# Patient Record
Sex: Female | Born: 1950 | Race: Black or African American | Hispanic: No | Marital: Single | Smoking: Current every day smoker
Health system: Southern US, Community
[De-identification: ages and names within clinical notes are randomized; demographics above are authoritative.]

## PROBLEM LIST (undated history)

## (undated) DIAGNOSIS — M5481 Occipital neuralgia: Secondary | ICD-10-CM

## (undated) DIAGNOSIS — E785 Hyperlipidemia, unspecified: Secondary | ICD-10-CM

## (undated) DIAGNOSIS — C50919 Malignant neoplasm of unspecified site of unspecified female breast: Secondary | ICD-10-CM

## (undated) DIAGNOSIS — I10 Essential (primary) hypertension: Secondary | ICD-10-CM

## (undated) DIAGNOSIS — M1990 Unspecified osteoarthritis, unspecified site: Secondary | ICD-10-CM

## (undated) DIAGNOSIS — Z923 Personal history of irradiation: Secondary | ICD-10-CM

## (undated) DIAGNOSIS — F17200 Nicotine dependence, unspecified, uncomplicated: Secondary | ICD-10-CM

## (undated) DIAGNOSIS — F329 Major depressive disorder, single episode, unspecified: Secondary | ICD-10-CM

## (undated) DIAGNOSIS — C801 Malignant (primary) neoplasm, unspecified: Secondary | ICD-10-CM

## (undated) HISTORY — DX: Hyperlipidemia, unspecified: E78.5

## (undated) HISTORY — PX: BREAST LUMPECTOMY: SHX2

## (undated) HISTORY — DX: Essential (primary) hypertension: I10

## (undated) HISTORY — DX: Occipital neuralgia: M54.81

## (undated) HISTORY — PX: OTHER SURGICAL HISTORY: SHX169

## (undated) HISTORY — DX: Nicotine dependence, unspecified, uncomplicated: F17.200

## (undated) HISTORY — PX: PARTIAL HYSTERECTOMY: SHX80

## (undated) HISTORY — DX: Major depressive disorder, single episode, unspecified: F32.9

## (undated) HISTORY — PX: FOOT SURGERY: SHX648

---

## 2015-12-02 ENCOUNTER — Other Ambulatory Visit: Payer: Self-pay | Admitting: Family

## 2015-12-02 DIAGNOSIS — M545 Low back pain: Secondary | ICD-10-CM | POA: Diagnosis not present

## 2015-12-02 DIAGNOSIS — R350 Frequency of micturition: Secondary | ICD-10-CM | POA: Diagnosis not present

## 2015-12-02 DIAGNOSIS — F419 Anxiety disorder, unspecified: Secondary | ICD-10-CM | POA: Diagnosis not present

## 2015-12-02 DIAGNOSIS — R3129 Other microscopic hematuria: Secondary | ICD-10-CM | POA: Diagnosis not present

## 2015-12-02 DIAGNOSIS — G8929 Other chronic pain: Secondary | ICD-10-CM | POA: Diagnosis not present

## 2015-12-02 DIAGNOSIS — Z1231 Encounter for screening mammogram for malignant neoplasm of breast: Secondary | ICD-10-CM

## 2015-12-02 DIAGNOSIS — F5104 Psychophysiologic insomnia: Secondary | ICD-10-CM | POA: Diagnosis not present

## 2015-12-02 DIAGNOSIS — Z136 Encounter for screening for cardiovascular disorders: Secondary | ICD-10-CM | POA: Diagnosis not present

## 2015-12-02 DIAGNOSIS — Z8349 Family history of other endocrine, nutritional and metabolic diseases: Secondary | ICD-10-CM | POA: Diagnosis not present

## 2015-12-09 ENCOUNTER — Ambulatory Visit: Payer: Self-pay

## 2015-12-22 ENCOUNTER — Ambulatory Visit: Payer: Self-pay

## 2015-12-30 DIAGNOSIS — L309 Dermatitis, unspecified: Secondary | ICD-10-CM | POA: Diagnosis not present

## 2015-12-30 DIAGNOSIS — Z1211 Encounter for screening for malignant neoplasm of colon: Secondary | ICD-10-CM | POA: Diagnosis not present

## 2015-12-30 DIAGNOSIS — F418 Other specified anxiety disorders: Secondary | ICD-10-CM | POA: Diagnosis not present

## 2015-12-30 DIAGNOSIS — E049 Nontoxic goiter, unspecified: Secondary | ICD-10-CM | POA: Diagnosis not present

## 2015-12-30 DIAGNOSIS — Z23 Encounter for immunization: Secondary | ICD-10-CM | POA: Diagnosis not present

## 2016-02-02 DIAGNOSIS — L309 Dermatitis, unspecified: Secondary | ICD-10-CM | POA: Diagnosis not present

## 2016-02-02 DIAGNOSIS — F418 Other specified anxiety disorders: Secondary | ICD-10-CM | POA: Diagnosis not present

## 2016-03-02 DIAGNOSIS — G4700 Insomnia, unspecified: Secondary | ICD-10-CM | POA: Diagnosis not present

## 2016-03-02 DIAGNOSIS — E559 Vitamin D deficiency, unspecified: Secondary | ICD-10-CM | POA: Diagnosis not present

## 2016-03-02 DIAGNOSIS — M170 Bilateral primary osteoarthritis of knee: Secondary | ICD-10-CM | POA: Diagnosis not present

## 2016-03-19 DIAGNOSIS — M25512 Pain in left shoulder: Secondary | ICD-10-CM | POA: Diagnosis not present

## 2016-03-19 DIAGNOSIS — E559 Vitamin D deficiency, unspecified: Secondary | ICD-10-CM | POA: Diagnosis not present

## 2016-03-21 ENCOUNTER — Emergency Department (HOSPITAL_COMMUNITY)
Admission: EM | Admit: 2016-03-21 | Discharge: 2016-03-21 | Disposition: A | Discharge status: Home / Self Care | Payer: Commercial Managed Care - HMO | Attending: Emergency Medicine | Admitting: Emergency Medicine

## 2016-03-21 ENCOUNTER — Encounter (HOSPITAL_COMMUNITY): Payer: Self-pay | Admitting: *Deleted

## 2016-03-21 ENCOUNTER — Emergency Department (HOSPITAL_COMMUNITY): Payer: Commercial Managed Care - HMO

## 2016-03-21 DIAGNOSIS — R52 Pain, unspecified: Secondary | ICD-10-CM

## 2016-03-21 DIAGNOSIS — M19012 Primary osteoarthritis, left shoulder: Secondary | ICD-10-CM | POA: Insufficient documentation

## 2016-03-21 DIAGNOSIS — M1990 Unspecified osteoarthritis, unspecified site: Secondary | ICD-10-CM

## 2016-03-21 DIAGNOSIS — Z91040 Latex allergy status: Secondary | ICD-10-CM | POA: Diagnosis not present

## 2016-03-21 DIAGNOSIS — M25512 Pain in left shoulder: Secondary | ICD-10-CM | POA: Diagnosis not present

## 2016-03-21 DIAGNOSIS — M25412 Effusion, left shoulder: Secondary | ICD-10-CM | POA: Diagnosis not present

## 2016-03-21 DIAGNOSIS — F17200 Nicotine dependence, unspecified, uncomplicated: Secondary | ICD-10-CM | POA: Diagnosis not present

## 2016-03-21 HISTORY — DX: Unspecified osteoarthritis, unspecified site: M19.90

## 2016-03-21 LAB — BASIC METABOLIC PANEL
Anion gap: 8 (ref 5–15)
BUN: 7 mg/dL (ref 6–20)
CALCIUM: 9.7 mg/dL (ref 8.9–10.3)
CO2: 25 mmol/L (ref 22–32)
CREATININE: 0.75 mg/dL (ref 0.44–1.00)
Chloride: 103 mmol/L (ref 101–111)
GFR calc Af Amer: >60 mL/min (ref >60)
GLUCOSE: 73 mg/dL (ref 65–99)
POTASSIUM: 4.2 mmol/L (ref 3.5–5.1)
SODIUM: 136 mmol/L (ref 135–145)

## 2016-03-21 LAB — CBC WITH DIFFERENTIAL/PLATELET
BASOS ABS: 0.1 10*3/uL (ref 0.0–0.1)
Basophils Relative: 1 %
EOS ABS: 0.1 10*3/uL (ref 0.0–0.7)
Eosinophils Relative: 1 %
HEMATOCRIT: 39.8 % (ref 36.0–46.0)
Hemoglobin: 13.7 g/dL (ref 12.0–15.0)
LYMPHS ABS: 3.1 10*3/uL (ref 0.7–4.0)
Lymphocytes Relative: 46 %
MCH: 33 pg (ref 26.0–34.0)
MCHC: 34.4 g/dL (ref 30.0–36.0)
MCV: 95.9 fL (ref 78.0–100.0)
MONO ABS: 0.5 10*3/uL (ref 0.1–1.0)
Monocytes Relative: 7 %
NEUTROS ABS: 3.1 10*3/uL (ref 1.7–7.7)
Neutrophils Relative %: 45 %
PLATELETS: 251 10*3/uL (ref 150–400)
RBC: 4.15 MIL/uL (ref 3.87–5.11)
RDW: 13.3 % (ref 11.5–15.5)
WBC: 6.9 10*3/uL (ref 4.0–10.5)

## 2016-03-21 MED ORDER — PREDNISONE 10 MG PO TABS: 20.0000 mg | ORAL_TABLET | Freq: Two times a day (BID) | ORAL | 0 refills | Status: DC

## 2016-03-21 NOTE — ED Provider Notes (Signed)
[facility] DEPT Provider Note   CSN: [id number] Arrival date & time: 03/21/16  1040     History   Chief Complaint Chief Complaint  Patient presents with  . Edema    HPI Christina Sanchez is a 65 y.o. female.  She presents for evaluation of pain in her left clavicle region radiating to her posterior left shoulder and left arm, present for 5 days, without known trauma. No prior similar problem. She saw her PCP, for same, 2 days ago, at that time she had a breast examination, and no masses, polyps or drainage was found. She was referred to orthopedics home. She saw today, and after that was sent here for further evaluation. The orthopedic provider told the family that they were concerned that the patient has "an infection". The patient denies fever, chills, nausea, vomiting, cough, shortness of breath, weakness or dizziness. She has a history of "knee arthritis". There are no other known modifying factors.  HPI  Past Medical History:  Diagnosis Date  . Arthritis     There are no active problems to display for this patient.   History reviewed. No pertinent surgical history.  OB History    No data available       Home Medications    Prior to Admission medications   Medication Sig Start Date End Date Taking? Authorizing Provider  oxyCODONE-acetaminophen (PERCOCET) 7.5-325 MG tablet Take 1 tablet by mouth 3 (three) times daily as needed for pain. 03/02/16  Yes Historical Provider, MD  zolpidem (AMBIEN) 10 MG tablet Take 10 mg by mouth at bedtime. 03/15/16  Yes Historical Provider, MD  predniSONE (DELTASONE) 10 MG tablet Take 2 tablets (20 mg total) by mouth 2 (two) times daily. 03/21/16   Daleen Bo, MD    Family History History reviewed. No pertinent family history.  Social History Social History  Substance Use Topics  . Smoking status: Current Every Day Smoker  . Smokeless tobacco: Not on file  . Alcohol use Yes     Comment: occ wine     Allergies     Latex; Lexapro [escitalopram]; and Penicillins   Review of Systems Review of Systems  All other systems reviewed and are negative.    Physical Exam Updated Vital Signs BP 125/77   Pulse 71   Temp 98.4 F (36.9 C) (Oral)   Resp 18   Ht 5\' 6"  (1.676 m)   Wt 141 lb (64 kg)   SpO2 97%   BMI 22.76 kg/m   Physical Exam  Constitutional: She is oriented to person, place, and time. She appears well-developed and well-nourished.  HENT:  Head: Normocephalic and atraumatic.  Eyes: Conjunctivae and EOM are normal. Pupils are equal, round, and reactive to light.  Neck: Normal range of motion and phonation normal. Neck supple. No tracheal deviation present. No thyromegaly present.  Cardiovascular: Normal rate and regular rhythm.   Pulmonary/Chest: Effort normal and breath sounds normal. No stridor. She exhibits no tenderness.  Abdominal: Soft. She exhibits no distension. There is no tenderness. There is no guarding.  Musculoskeletal:  Somewhat decreased left arm and shoulder motion secondary to pain in the left medial clavicle. Left clavicular ulcer junction, with localized swelling, firm in consistency, overlying redness, and moderate tenderness. There is some mild indistinct associated small lymphadenopathy in the left supra clavicular region.  Neurological: She is alert and oriented to person, place, and time. She exhibits normal muscle tone.  Skin: Skin is warm and dry.  Psychiatric: She has a normal mood  and affect. Her behavior is normal. Judgment and thought content normal.  Nursing note and vitals reviewed.    ED Treatments / Results  Labs (all labs ordered are listed, but only abnormal results are displayed) Labs Reviewed  BASIC METABOLIC PANEL  CBC WITH DIFFERENTIAL/PLATELET    EKG  EKG Interpretation None       Radiology No results found.  Procedures Procedures (including critical care time)  Medications Ordered in ED Medications - No data to  display   Initial Impression / Assessment and Plan / ED Course  I have reviewed the triage vital signs and the nursing notes.  Pertinent labs & imaging results that were available during my care of the patient were reviewed by me and considered in my medical decision making (see chart for details).  Clinical Course     Medications - No data to display  No data found. Mr Shoulder Left Wo Contrast  Result Date: 03/21/2016 CLINICAL DATA:  Swelling along the left medial clavicle. Pain radiating into the shoulder region. EXAM: MRI OF THE STERNOCLAVICULAR JOINTS WITHOUT CONTRAST TECHNIQUE: Multiplanar, multisequence MR imaging of the sternoclavicular joints was performed. No intravenous contrast was administered. COMPARISON:  None. FINDINGS: There is a small (approximately 100 cubic mm) anterior effusion of the left sternoclavicular joint associated with surrounding subcutaneous edema, corresponding directly to the marked area of concern. This is asymmetric when compared to the contralateral side. There is no underlying marrow edema and no cortical destruction or other worrisome features within the joint itself. Sternum otherwise unremarkable where included. No retrosternal edema. No other significant regional abnormality is observed. IMPRESSION: 1. There is a small anterior effusion of the left sternoclavicular joint with surrounding subcutaneous edema. The effusion is only about 0.1 cc and hence very small and likely too small to drain or asperate. The surrounding subcutaneous edema extends over an approximately 4.2 by 3.5 cm region. No bony destruction along the joint or abnormal osseous edema. Although this could be a very early indicator of a septic sternoclavicular joint, this could also be from posttraumatic or sterile inflammatory arthropathy. Electronically Signed   By: Van Clines M.D.   On: 03/21/2016 15:36    At d/c Reevaluation with update and discussion. After initial assessment  and treatment, an updated evaluation reveals she remains comfortable. No change in clinical status. Findings discussed with patient and all questions answered. 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 Doubt septic arthritis, tumor or fracture.   Nursing Notes Reviewed/ Care Coordinated Applicable Imaging Reviewed Interpretation of Laboratory Data incorporated into ED treatment  The patient appears reasonably screened and/or stabilized for discharge and I doubt any other medical condition or other [facility] requiring further screening, evaluation, or treatment in the ED at this time prior to discharge.  Plan: Home Medications- continue; Home Treatments- rest, heat; return here if the recommended treatment, does not improve the symptoms; Recommended follow up- Ortho 1 week for check up   New Prescriptions Discharge Medication List as of 03/21/2016  4:25 PM    START taking these medications   Details  predniSONE (DELTASONE) 10 MG tablet Take 2 tablets (20 mg total) by mouth 2 (two) times daily., Starting Sun 03/21/2016, Print         Daleen Bo, MD 03/24/16 [phone]

## 2016-03-21 NOTE — ED Notes (Signed)
Patient returned from MRI.

## 2016-03-21 NOTE — ED Notes (Signed)
Family at bedside. 

## 2016-03-21 NOTE — ED Notes (Signed)
Patient transported to MRI 

## 2016-03-21 NOTE — Discharge Instructions (Signed)
Use heat on the sore area for 1 hour 4 times a day.  Use Tylenol if needed, for pain.  Your doctor can refer you to a specialist if you're not better in 1 week.

## 2016-03-21 NOTE — ED Triage Notes (Signed)
Pt reports swelling and pain to left clavicle area since wed morning, no injury. Went to pcp and sent to ortho md today. They sent her here for further eval including possible ct and fluid removal from site. Pt reports pain is radiating into shoulder area and decreased rom in her arm. Has not been started on antibiotics yet.

## 2016-11-29 ENCOUNTER — Other Ambulatory Visit: Payer: Self-pay | Admitting: *Deleted

## 2016-11-29 DIAGNOSIS — G5602 Carpal tunnel syndrome, left upper limb: Secondary | ICD-10-CM

## 2016-11-30 ENCOUNTER — Ambulatory Visit (INDEPENDENT_AMBULATORY_CARE_PROVIDER_SITE_OTHER): Payer: Medicare PPO | Admitting: Neurology

## 2016-11-30 DIAGNOSIS — G5602 Carpal tunnel syndrome, left upper limb: Secondary | ICD-10-CM

## 2016-11-30 DIAGNOSIS — R202 Paresthesia of skin: Secondary | ICD-10-CM

## 2016-11-30 NOTE — Procedures (Signed)
[facility] [place] [facility]  [place], [place]  [place], [place] [address] Tel: [phone] Fax:  [phone] Test Date:  11/30/2016  Patient: Christina Sanchez DOB: [birth year] Physician: Narda Amber, DO  Sex: Female Height: 5\' 6"  Ref Phys: Earlie Server, MD  ID#: [id number] Temp: 31.9C Technician:    Patient Complaints: This is a 66 year-old female referred for evaluation of left hand paresthesias to assess for carpal tunnel syndrome.  NCV & EMG Findings: Extensive electrodiagnostic testing of the left upper extremity shows:  1. Left median, ulnar, and mixed palmer sensory responses are within normal limits. 2. Left median and ulnar motor responses are within normal limits. 3. There is no evidence of active or chronic motor axon loss changes affecting any of the tested muscles. Motor unit configuration and recruitment pattern is within normal limits.  Impression: This is a normal study of the left upper extremity.   In particular, there is no evidence of carpal tunnel syndrome or cervical radiculopathy.   ___________________________ Narda Amber, DO    Nerve Conduction Studies Anti Sensory Summary Table   Site NR Peak (ms) Norm Peak (ms) P-T Amp (V) Norm P-T Amp  Left Median Anti Sensory (2nd Digit)  Wrist    3.6 <3.8 34.5 >10  Left Ulnar Anti Sensory (5th Digit)  Wrist    3.2 <3.2 22.0 >5   Motor Summary Table   Site NR Onset (ms) Norm Onset (ms) O-P Amp (mV) Norm O-P Amp Site1 Site2 Delta-0 (ms) Dist (cm) Vel (m/s) Norm Vel (m/s)  Left Median Motor (Abd Poll Brev)  Wrist    3.4 <4.0 10.7 >5 Elbow Wrist 4.9 27.0 55 >50  Elbow    8.3  10.1         Left Ulnar Motor (Abd Dig Minimi)  Wrist    2.3 <3.1 11.2 >7 B Elbow Wrist 3.9 24.0 62 >50  B Elbow    6.2  11.0  A Elbow B Elbow 1.5 10.0 67 >50  A Elbow    7.7  10.2          Comparison Summary Table   Site NR Peak (ms) Norm Peak (ms) P-T Amp (V) Site1 Site2 Delta-P (ms) Norm Delta (ms)  Left Median/Ulnar  Palm Comparison (Wrist - 8cm)  Median Palm    1.8 <2.2 39.9 Median Palm Ulnar Palm 0.1   Ulnar Palm    1.7 <2.2 10.4       EMG   Side Muscle Ins Act Fibs Psw Fasc Number Recrt Dur Dur. Amp Amp. Poly Poly. Comment  Left 1stDorInt Nml Nml Nml Nml Nml Nml Nml Nml Nml Nml Nml Nml N/A  Left PronatorTeres Nml Nml Nml Nml Nml Nml Nml Nml Nml Nml Nml Nml N/A  Left Biceps Nml Nml Nml Nml Nml Nml Nml Nml Nml Nml Nml Nml N/A  Left Triceps Nml Nml Nml Nml Nml Nml Nml Nml Nml Nml Nml Nml N/A  Left Deltoid Nml Nml Nml Nml Nml Nml Nml Nml Nml Nml Nml Nml N/A      Waveforms:

## 2017-03-11 ENCOUNTER — Ambulatory Visit (HOSPITAL_COMMUNITY): Payer: Medicare PPO

## 2017-03-11 ENCOUNTER — Other Ambulatory Visit (HOSPITAL_COMMUNITY): Payer: Self-pay | Admitting: Orthopedic Surgery

## 2017-03-11 DIAGNOSIS — M79605 Pain in left leg: Secondary | ICD-10-CM

## 2017-03-11 DIAGNOSIS — M7989 Other specified soft tissue disorders: Principal | ICD-10-CM

## 2017-05-05 DIAGNOSIS — S83242D Other tear of medial meniscus, current injury, left knee, subsequent encounter: Secondary | ICD-10-CM | POA: Diagnosis not present

## 2017-05-19 DIAGNOSIS — S83242D Other tear of medial meniscus, current injury, left knee, subsequent encounter: Secondary | ICD-10-CM | POA: Diagnosis not present

## 2017-06-06 DIAGNOSIS — Z8679 Personal history of other diseases of the circulatory system: Secondary | ICD-10-CM | POA: Diagnosis not present

## 2017-06-06 DIAGNOSIS — F17210 Nicotine dependence, cigarettes, uncomplicated: Secondary | ICD-10-CM | POA: Diagnosis not present

## 2017-06-06 DIAGNOSIS — M170 Bilateral primary osteoarthritis of knee: Secondary | ICD-10-CM | POA: Diagnosis not present

## 2017-06-06 DIAGNOSIS — G4700 Insomnia, unspecified: Secondary | ICD-10-CM | POA: Diagnosis not present

## 2017-06-20 DIAGNOSIS — S83242D Other tear of medial meniscus, current injury, left knee, subsequent encounter: Secondary | ICD-10-CM | POA: Diagnosis not present

## 2017-08-30 DIAGNOSIS — G4700 Insomnia, unspecified: Secondary | ICD-10-CM | POA: Diagnosis not present

## 2017-08-30 DIAGNOSIS — Z1231 Encounter for screening mammogram for malignant neoplasm of breast: Secondary | ICD-10-CM | POA: Diagnosis not present

## 2017-08-30 DIAGNOSIS — Z8679 Personal history of other diseases of the circulatory system: Secondary | ICD-10-CM | POA: Diagnosis not present

## 2017-08-30 DIAGNOSIS — Z1211 Encounter for screening for malignant neoplasm of colon: Secondary | ICD-10-CM | POA: Diagnosis not present

## 2017-08-30 DIAGNOSIS — M170 Bilateral primary osteoarthritis of knee: Secondary | ICD-10-CM | POA: Diagnosis not present

## 2017-08-30 DIAGNOSIS — F17210 Nicotine dependence, cigarettes, uncomplicated: Secondary | ICD-10-CM | POA: Diagnosis not present

## 2017-12-31 DIAGNOSIS — M25511 Pain in right shoulder: Secondary | ICD-10-CM | POA: Diagnosis not present

## 2017-12-31 DIAGNOSIS — F17200 Nicotine dependence, unspecified, uncomplicated: Secondary | ICD-10-CM | POA: Diagnosis not present

## 2018-01-03 ENCOUNTER — Other Ambulatory Visit: Payer: Self-pay | Admitting: Nurse Practitioner

## 2018-01-03 DIAGNOSIS — Z1231 Encounter for screening mammogram for malignant neoplasm of breast: Secondary | ICD-10-CM

## 2018-01-05 DIAGNOSIS — G4700 Insomnia, unspecified: Secondary | ICD-10-CM | POA: Diagnosis not present

## 2018-02-02 ENCOUNTER — Ambulatory Visit: Payer: Medicare PPO

## 2018-02-23 ENCOUNTER — Ambulatory Visit
Admission: RE | Admit: 2018-02-23 | Discharge: 2018-02-23 | Disposition: A | Discharge status: Home / Self Care | Payer: Medicare HMO | Source: Ambulatory Visit | Attending: Nurse Practitioner | Admitting: Nurse Practitioner

## 2018-02-23 DIAGNOSIS — Z1231 Encounter for screening mammogram for malignant neoplasm of breast: Secondary | ICD-10-CM

## 2018-03-02 DIAGNOSIS — F17200 Nicotine dependence, unspecified, uncomplicated: Secondary | ICD-10-CM | POA: Diagnosis not present

## 2018-03-02 DIAGNOSIS — R7301 Impaired fasting glucose: Secondary | ICD-10-CM | POA: Diagnosis not present

## 2018-03-02 DIAGNOSIS — E559 Vitamin D deficiency, unspecified: Secondary | ICD-10-CM | POA: Diagnosis not present

## 2018-03-02 DIAGNOSIS — F419 Anxiety disorder, unspecified: Secondary | ICD-10-CM | POA: Diagnosis not present

## 2018-03-02 DIAGNOSIS — Z23 Encounter for immunization: Secondary | ICD-10-CM | POA: Diagnosis not present

## 2018-03-02 DIAGNOSIS — I10 Essential (primary) hypertension: Secondary | ICD-10-CM | POA: Diagnosis not present

## 2018-03-02 DIAGNOSIS — Z13220 Encounter for screening for lipoid disorders: Secondary | ICD-10-CM | POA: Diagnosis not present

## 2018-03-02 DIAGNOSIS — M170 Bilateral primary osteoarthritis of knee: Secondary | ICD-10-CM | POA: Diagnosis not present

## 2018-03-02 DIAGNOSIS — E049 Nontoxic goiter, unspecified: Secondary | ICD-10-CM | POA: Diagnosis not present

## 2019-01-22 ENCOUNTER — Emergency Department (HOSPITAL_COMMUNITY): Payer: Medicare Other

## 2019-01-22 ENCOUNTER — Emergency Department (HOSPITAL_COMMUNITY)
Admission: EM | Admit: 2019-01-22 | Discharge: 2019-01-23 | Disposition: A | Discharge status: Home / Self Care | Payer: Medicare Other | Attending: Emergency Medicine | Admitting: Emergency Medicine

## 2019-01-22 ENCOUNTER — Other Ambulatory Visit: Payer: Self-pay

## 2019-01-22 DIAGNOSIS — F17210 Nicotine dependence, cigarettes, uncomplicated: Secondary | ICD-10-CM | POA: Diagnosis not present

## 2019-01-22 DIAGNOSIS — Z91040 Latex allergy status: Secondary | ICD-10-CM | POA: Insufficient documentation

## 2019-01-22 DIAGNOSIS — R519 Headache, unspecified: Secondary | ICD-10-CM | POA: Insufficient documentation

## 2019-01-22 DIAGNOSIS — Z79899 Other long term (current) drug therapy: Secondary | ICD-10-CM | POA: Diagnosis not present

## 2019-01-22 NOTE — ED Triage Notes (Signed)
Pt sts over one month ago she bumped her head on the corner of the bathroom wall and since then has had a headache. Her left nostril stays dripping/congested. Pt sts sometimes she has blurry vision in her right eye and sometimes it's in her left eye. Pt ambulatory, has been working. NAD.

## 2019-01-23 MED ORDER — TRAMADOL HCL 50 MG PO TABS: 50.0000 mg | ORAL_TABLET | Freq: Four times a day (QID) | ORAL | 0 refills | Status: DC | PRN

## 2019-01-23 MED ORDER — CYCLOBENZAPRINE HCL 5 MG PO TABS: 5.0000 mg | ORAL_TABLET | Freq: Two times a day (BID) | ORAL | 0 refills | Status: DC | PRN

## 2019-01-23 NOTE — ED Notes (Signed)
Patient verbalizes understanding of discharge instructions. Opportunity for questioning and answers were provided. Armband removed by staff, pt discharged from ED ambulatory.   

## 2019-01-23 NOTE — ED Provider Notes (Signed)
[facility] EMERGENCY DEPARTMENT Provider Note   CSN: [id number] Arrival date & time: 01/22/19  1733     History   Chief Complaint Chief Complaint  Patient presents with  . Headache    HPI Christina Sanchez is a 68 y.o. female.     Patient presents to the emergency department for evaluation of headache.  Patient reports that she hit her head when she bent down in her bathroom approximately a month ago.  She reports that she struck her head on the bathroom vanity, did not lose consciousness.  She has been experiencing headaches ever since.  She called her doctor last week but has not been given an appointment for follow-up.  She says that the headaches have been persistent, only partially relieved with Aleve.  She has now noticed pain on both sides of her neck and has been having intermittent blurred vision.  She reports previously she had been experiencing right-sided blurred vision intermittently but today she noticed some pain on the left side of her head as well as blurred vision to the left side which was new so she came to the ER.     Past Medical History:  Diagnosis Date  . Arthritis     There are no active problems to display for this patient.   No past surgical history on file.   OB History   No obstetric history on file.      Home Medications    Prior to Admission medications   Medication Sig Start Date End Date Taking? Authorizing Provider  cyclobenzaprine (FLEXERIL) 5 MG tablet Take 1 tablet (5 mg total) by mouth 2 (two) times daily as needed for muscle spasms. 01/23/19   Orpah Greek, MD  traMADol (ULTRAM) 50 MG tablet Take 1 tablet (50 mg total) by mouth every 6 (six) hours as needed. 01/23/19   Orpah Greek, MD  zolpidem (AMBIEN) 10 MG tablet Take 10 mg by mouth at bedtime. 03/15/16   [provider]    Family History No family history on file.  Social History Social History   Tobacco Use  . Smoking  status: Current Every Day Smoker  Substance Use Topics  . Alcohol use: Yes    Comment: occ wine  . Drug use: No     Allergies   Latex, Lexapro [escitalopram], and Penicillins   Review of Systems Review of Systems  Eyes: Positive for visual disturbance.  Musculoskeletal: Positive for neck pain.  Neurological: Positive for headaches.  All other systems reviewed and are negative.    Physical Exam Updated Vital Signs BP (!) 174/79 (BP Location: Left Arm)   Pulse (!) 50   Temp 98.5 F (36.9 C) (Oral)   Resp 18   SpO2 100%   Physical Exam Vitals signs and nursing note reviewed.  Constitutional:      General: She is not in acute distress.    Appearance: Normal appearance. She is well-developed.  HENT:     Head: Normocephalic and atraumatic.     Right Ear: Hearing normal.     Left Ear: Hearing normal.     Nose: Nose normal.  Eyes:     Conjunctiva/sclera: Conjunctivae normal.     Pupils: Pupils are equal, round, and reactive to light.  Neck:     Musculoskeletal: Normal range of motion and neck supple. Normal range of motion. Muscular tenderness present.   Cardiovascular:     Rate and Rhythm: Regular rhythm.     Heart sounds:  S1 normal and S2 normal. No murmur. No friction rub. No gallop.   Pulmonary:     Effort: Pulmonary effort is normal. No respiratory distress.     Breath sounds: Normal breath sounds.  Chest:     Chest wall: No tenderness.  Abdominal:     General: Bowel sounds are normal.     Palpations: Abdomen is soft.     Tenderness: There is no abdominal tenderness. There is no guarding or rebound. Negative signs include Murphy's sign and McBurney's sign.     Hernia: No hernia is present.  Musculoskeletal: Normal range of motion.  Skin:    General: Skin is warm and dry.     Findings: No rash.  Neurological:     Mental Status: She is alert and oriented to person, place, and time.     GCS: GCS eye subscore is 4. GCS verbal subscore is 5. GCS motor subscore  is 6.     Cranial Nerves: No cranial nerve deficit.     Sensory: No sensory deficit.     Coordination: Coordination normal.     Comments: Normal strength and sensation in all 4 extremities, no focal deficits  Psychiatric:        Speech: Speech normal.        Behavior: Behavior normal.        Thought Content: Thought content normal.      ED Treatments / Results  Labs (all labs ordered are listed, but only abnormal results are displayed) Labs Reviewed - No data to display  EKG None  Radiology Ct Head Wo Contrast  Result Date: 01/22/2019 CLINICAL DATA:  Headache EXAM: CT HEAD WITHOUT CONTRAST TECHNIQUE: Contiguous axial images were obtained from the base of the skull through the vertex without intravenous contrast. COMPARISON:  None. FINDINGS: Brain: No evidence of acute infarction, hemorrhage, hydrocephalus, extra-axial collection or mass lesion/mass effect. Vascular: No hyperdense vessel or unexpected calcification. Skull: Normal. Negative for fracture or focal lesion. Sinuses/Orbits: No acute finding.Small fluid level left maxillary sinus. Other: None. IMPRESSION: Negative non contrasted CT appearance of the brain. Electronically Signed   By: Donavan Foil M.D.   On: 01/22/2019 23:50    Procedures Procedures (including critical care time)  Medications Ordered in ED Medications - No data to display   Initial Impression / Assessment and Plan / ED Course  I have reviewed the triage vital signs and the nursing notes.  Pertinent labs & imaging results that were available during my care of the patient were reviewed by me and considered in my medical decision making (see chart for details).        Patient presents to the emergency department for evaluation of daily headaches after minor head trauma that occurred more than a month ago.  She has no focal neurologic deficits.  She has been taking Aleve daily, question possibility of analgesic rebound headache.  Her exam is  completely unremarkable.  She does report intermittent rhinorrhea but she does not have any currently, doubt CSF rhinorrhea.  CT head is unremarkable.  Neck pain is lateral on both sides and consistent with soft tissue pain and likely tension/muscle spasm.  She does not want any medication given to her here in the ER for her headache, will provide with analgesia and muscle relaxer.  Follow-up with neurology.  Final Clinical Impressions(s) / ED Diagnoses   Final diagnoses:  Bad headache    ED Discharge Orders         Ordered  traMADol (ULTRAM) 50 MG tablet  Every 6 hours PRN     01/23/19 0027    cyclobenzaprine (FLEXERIL) 5 MG tablet  2 times daily PRN     01/23/19 0027           Orpah Greek, MD 01/23/19 [phone]

## 2019-01-31 ENCOUNTER — Ambulatory Visit: Payer: Medicare Other | Admitting: Neurology

## 2019-01-31 ENCOUNTER — Other Ambulatory Visit: Payer: Self-pay

## 2019-01-31 ENCOUNTER — Encounter: Payer: Self-pay | Admitting: Neurology

## 2019-01-31 VITALS — BP 118/88 | HR 76 | Temp 97.6°F | Ht 66.0 in | Wt 142.0 lb

## 2019-01-31 DIAGNOSIS — M5382 Other specified dorsopathies, cervical region: Secondary | ICD-10-CM

## 2019-01-31 DIAGNOSIS — R519 Headache, unspecified: Secondary | ICD-10-CM

## 2019-01-31 DIAGNOSIS — M5481 Occipital neuralgia: Secondary | ICD-10-CM

## 2019-01-31 MED ORDER — TIZANIDINE HCL 4 MG PO TABS: 4.0000 mg | ORAL_TABLET | Freq: Four times a day (QID) | ORAL | 3 refills | Status: DC | PRN

## 2019-01-31 NOTE — Progress Notes (Signed)
Nerve block w/o steroid: Pt signed consent  0.5% Bupivocaine 3 mL LOT: [id number] EXP: 11/2019 NDC: [id number]  2% Lidocaine 3 mL LOT: [id number] EXP: 10/11/2019 NDC: [id number]

## 2019-01-31 NOTE — Patient Instructions (Addendum)
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  In addition, the nerves control some of the muscles of the scalp.  Occipital neuralgia is an irritation of these nerves which can cause headaches, numbness of the scalp, and neck discomfort.     The occipital nerve block will interrupt nerve transmission through these nerves and can relieve pain and spasm.  The block consists of insertion of a small needle under the skin in the back of the head to deposit local anesthetic (numbing medicine) and/or steroids around the nerve.  The entire block usually lasts less than 5 minutes.  Conditions which may be treated by occipital blocks:   Muscular pain and spasm of the scalp  Nerve irritation, back of the head  Headaches  Upper neck pain  Preparation for the injection:  1. Do not eat any solid food or dairy products within 8 hours of your appointment. 2. You may drink clear liquids up to 3 hours before appointment.  Clear liquids include water, black coffee, juice or soda.  No milk or cream please. 3. You may take your regular medication, including pain medications, with a sip of water before you appointment.  Diabetics should hold regular insulin (if taken separately) and take 1/2 normal NPH dose the morning of the procedure.  Carry some sugar containing items with you to your appointment. 4. A driver must accompany you and be prepared to drive you home after your procedure. 5. Bring all your current medications with you. 6. An IV may be inserted and sedation may be given at the discretion of the physician. 7. A blood pressure cuff, EKG, and other monitors will often be applied during the procedure.  Some patients may need to have  extra oxygen administered for a short period. 8. You will be asked to provide medical information, including your allergies and medications, prior to the procedure.  We must know immediately if you are taking blood thinners (like Coumadin/Warfarin) or if you are allergic to IV iodine contrast (dye).  We must know if you could possible be pregnant.  9. Do not wear a high collared shirt or turtleneck.  Tie long hair up in the back if possible.  Possible side-effects:   Bleeding from needle site  Infection (rare, may require surgery)  Nerve injury (rare)  Hair on back of neck can be tinged with iodine scrub (this will wash out)  Light-headedness (temporary)  Pain at injection site (several days)  Decreased blood pressure (rare, temporary)  Seizure (very rare)  Call if you experience:   Hives or difficulty breathing ( go to the emergency room)  Inflammation or drainage at the injection site(s)  Please note:  Although the local anesthetic injected can often make your painful muscles or headache feel good for several hours after the injection, the pain may return.  It takes 3-7 days for steroids to work.  You may not notice any pain relief for at least one week.  If effective, we will often do a series of injections spaced 3-6 weeks apart to maximally decrease your pain.  If you have any questions, please call [phone] [facility]   Occipital Neuralgia  Occipital neuralgia is a type of headache  that causes brief episodes of very bad pain in the back of your head. Pain from occipital neuralgia may spread (radiate) to other parts of your head. These headaches may be caused by irritation of the nerves that leave your spinal cord high up in your neck, just below the base of your skull (occipital nerves). Your occipital nerves transmit sensations from the back of your head, the top of your head, and the areas behind your ears. What are the  causes? This condition can occur without any known cause (primary headache syndrome). In other cases, this condition is caused by pressure on or irritation of one of the two occipital nerves. Pressure and irritation may be due to:  Muscle spasm in the neck.  Neck injury.  Wear and tear of the vertebrae in the neck (osteoarthritis).  Disease of the disks that separate the vertebrae.  Swollen blood vessels that put pressure on the occipital nerves.  Infections.  Tumors.  Diabetes. What are the signs or symptoms? This condition causes brief burning, stabbing, electric, shocking, or shooting pain which can radiate to the top of the head. It can happen on one side or both sides of the head. It can also cause:  Pain behind the eye.  Pain triggered by neck movement or hair brushing.  Scalp tenderness.  Aching in the back of the head between episodes of very bad pain.  Pain gets worse with exposure to bright lights. How is this diagnosed? There is no test that diagnoses this condition. Your health care provider may diagnose this condition based on a physical exam and your symptoms. Other tests may be done, such as:  Imaging studies of the brain and neck (cervical spine), such as an MRI or CT scan. These look for causes of pinched nerves.  Applying pressure to the nerves in the neck to try to re-create the pain.  Injection of numbing medicine into the occipital nerve areas to see if pain goes away (diagnostic nerve block). How is this treated? Treatment for this condition may begin with simple measures, such as:  Rest.  Massage.  Applying heat or cold on the area.  Over-the-counter pain relievers. If these measures do not work, you may need other treatments, including:  Medicines, such as: ? Prescription-strength anti-inflammatory medicines. ? Muscle relaxants. ? Anti-seizure medicines, which can relieve pain. ? Antidepressants, which can relieve pain. ? Injected  medicines, such as medicines that numb the area (local anesthetic) and steroids.  Pulsed radiofrequency ablation. This is when wires are implanted to deliver electrical impulses that block pain signals from the occipital nerve.  Surgery to relieve nerve pressure.  Physical therapy. Follow these instructions at home: Pain management      Avoid any activities that cause pain.  Rest when you have an attack of pain.  Try gentle massage to relieve pain.  Try a different pillow or sleeping position.  If directed, apply heat to the affected area as told by your health care provider. Use the heat source that your health care provider recommends, such as a moist heat pack or a heating pad. ? Place a towel between your skin and the heat source. ? Leave the heat on for 20-30 minutes. ? Remove the heat if your skin turns bright red. This is especially important if you are unable to feel pain, heat, or cold. You may have a greater risk of getting burned.  If directed, apply ice to the back of the head and neck area as told  by your health care provider. ? Put ice in a plastic bag. ? Place a towel between your skin and the bag. ? Leave the ice on for 20 minutes, 2-3 times per day. General instructions  Take over-the-counter and prescription medicines only as told by your health care provider.  Avoid things that make your symptoms worse, such as bright lights.  Try to stay active. Get regular exercise that does not cause pain. Ask your health care provider to suggest safe exercises for you.  Work with a physical therapist to learn stretching exercises you can do at home.  Practice good posture.  Keep all follow-up visits as told by your health care provider. This is important. Contact a health care provider if:  Your medicine is not working.  You have new or worsening symptoms. Get help right away if:  You have very bad head pain that does not go away.  You have a sudden change in  vision, balance, or speech. Summary  Occipital neuralgia is a type of headache that causes brief episodes of very bad pain in the back of your head.  Pain from occipital neuralgia may spread (radiate) to other parts of your head.  Treatment for this condition includes rest, massage, and medicines. This information is not intended to replace advice given to you by your health care provider. Make sure you discuss any questions you have with your health care provider. Document Released: 03/23/2001 Document Revised: 03/15/2017 Document Reviewed: 06/03/2016 Elsevier Patient Education  [employer].  Tizanidine tablets or capsules What is this medicine? TIZANIDINE (tye ZAN i deen) helps to relieve muscle spasms. It may be used to help in the treatment of multiple sclerosis and spinal cord injury. This medicine may be used for other purposes; ask your health care provider or pharmacist if you have questions. COMMON BRAND NAME(S): Zanaflex What should I tell my health care provider before I take this medicine? They need to know if you have any of these conditions:  kidney disease  liver disease  low blood pressure  mental disorder  an unusual or allergic reaction to tizanidine, other medicines, lactose (tablets only), foods, dyes, or preservatives  pregnant or trying to get pregnant  breast-feeding How should I use this medicine? Take this medicine by mouth with a full glass of water. Take this medicine on an empty stomach, at least 30 minutes before or 2 hours after food. Do not take with food unless you talk with your doctor. Follow the directions on the prescription label. Take your medicine at regular intervals. Do not take your medicine more often than directed. Do not stop taking except on your doctor's advice. Suddenly stopping the medicine can be very dangerous. Talk to your pediatrician regarding the use of this medicine in children. Patients over 60 years old may have a  stronger reaction and need a smaller dose. Overdosage: If you think you have taken too much of this medicine contact a poison control center or emergency room at once. NOTE: This medicine is only for you. Do not share this medicine with others. What if I miss a dose? If you miss a dose, take it as soon as you can. If it is almost time for your next dose, take only that dose. Do not take double or extra doses. What may interact with this medicine? Do not take this medicine with any of the following medications:  ciprofloxacin  fluvoxamine  narcotic medicines for cough  thiabendazole This medicine may also interact  with the following medications:  acyclovir  alcohol  antihistamines for allergy, cough, and cold  baclofen  certain medicines for anxiety or sleep  certain medicines for blood pressure, heart disease, irregular heartbeat  certain medicines for depression like amitriptyline, fluoxetine, sertraline  certain medicines for seizures like phenobarbital, primidone  certain medicines for stomach problems like cimetidine, famotidine  female hormones, like estrogens or progestins and birth control pills, patches, rings, or injections  general anesthetics like halothane, isoflurane, methoxyflurane, propofol  local anesthetics like lidocaine, pramoxine, tetracaine  medicines that relax muscles for surgery  narcotic medicines for pain  phenothiazines like chlorpromazine, mesoridazine, prochlorperazine  ticlopidine  zileuton This list may not describe all possible interactions. Give your health care provider a list of all the medicines, herbs, non-prescription drugs, or dietary supplements you use. Also tell them if you smoke, drink alcohol, or use illegal drugs. Some items may interact with your medicine. What should I watch for while using this medicine? Tell your doctor or health care professional if your symptoms do not start to get better or if they get worse. You  may get drowsy or dizzy. Do not drive, use machinery, or do anything that needs mental alertness until you know how this medicine affects you. Do not stand or sit up quickly, especially if you are an older patient. This reduces the risk of dizzy or fainting spells. Alcohol may interfere with the effect of this medicine. Avoid alcoholic drinks. If you are taking another medicine that also causes drowsiness, you may have more side effects. Give your health care provider a list of all medicines you use. Your doctor will tell you how much medicine to take. Do not take more medicine than directed. Call emergency for help if you have problems breathing or unusual sleepiness. Your mouth may get dry. Chewing sugarless gum or sucking hard candy, and drinking plenty of water may help. Contact your doctor if the problem does not go away or is severe. What side effects may I notice from receiving this medicine? Side effects that you should report to your doctor or health care professional as soon as possible:  allergic reactions like skin rash, itching or hives, swelling of the face, lips, or tongue  breathing problems  hallucinations  signs and symptoms of liver injury like dark yellow or brown urine; general ill feeling or flu-like symptoms; light-colored stools; loss of appetite; nausea; right upper quadrant belly pain; unusually weak or tired; yellowing of the eyes or skin  signs and symptoms of low blood pressure like dizziness; feeling faint or lightheaded, falls; unusually weak or tired  unusually slow heartbeat  unusually weak or tired Side effects that usually do not require medical attention (report to your doctor or health care professional if they continue or are bothersome):  blurred vision  constipation  dizziness  dry mouth  tiredness This list may not describe all possible side effects. Call your doctor for medical advice about side effects. You may report side effects to FDA at  1-800-FDA-1088. Where should I keep my medicine? Keep out of the reach of children. Store at room temperature between 15 and 30 degrees C (59 and 86 degrees F). Throw away any unused medicine after the expiration date. NOTE: This sheet is a summary. It may not cover all possible information. If you have questions about this medicine, talk to your doctor, pharmacist, or health care provider.  2020 Elsevier/Gold Standard (2017-01-11 13:33:29)

## 2019-01-31 NOTE — Progress Notes (Signed)
[facility]    Provider:  Dr Jaynee Eagles Requesting Provider: No ref. provider found Emergency Room Primary Care Provider:  Leeroy Cha, MD  CC:  Occipital neuralgia  HPI:  Christina Sanchez is a 68 y.o. female here as requested by the emergency room for headaches. In July she hit her head, she bent down and hit her head on the corner, she "saw stars", no LOC, no vomiting. The headache comes and goes. The pain is in the occipital area and shoots up the back of the neck. It comes and goes, it makes her vision blurry it is so painful, severe, she has a lot of tightness in the shoulders, just on the left, started after she hit her head, it can come and go and happens every few days, severe, shooting a burning, will stay for a few days and then ease off. She takes alleve and that helps, worse with pain in the muscles on the left. Stiffness on the left in the muscles, but no significant neck pain.No other focal neurologic deficits, associated symptoms, inciting events or modifiable factors.  Reviewed notes, labs and imaging from outside physicians, which showed:  I reviewed notes from the emergency room earlier this month January 22, 2019.  She presented for headache.  She hit her head when she bent down in her bathroom approximately a month prior, struck her head on the bathroom vanity, did not lose consciousness, ever since experiencing headaches.  She was not able to get an appointment with her primary care.  Headaches persistent only partially relieved with Aleve.  She now has pain on both sides of the neck and has been having intermittent blurry vision.  Also noticed pain in the left side of her head as well as blurred vision to the left side which is new show she was seen in the emergency room.  Looks like she was given Flexeril and tramadol.  Reported current every day smoker.  No focal deficits.  They questioned analgesic rebound headache.  Exam normal.  Neck pain consistent  with Teston, muscle pain.  CT of the head was normal.    Ct showed No acute intracranial abnormalities including mass lesion or mass effect, hydrocephalus, extra-axial fluid collection, midline shift, hemorrhage, or acute infarction, large ischemic events (personally reviewed images)     Review of Systems: Patient complains of symptoms per HPI as well as the following symptoms neck tightness, headache. Pertinent negatives and positives per HPI. All others negative.   Social History   Socioeconomic History  . Marital status: Single    Spouse name: Not on file  . Number of children: 1  . Years of education: Not on file  . Highest education level: High school graduate  Occupational History  . Not on file  Social Needs  . Financial resource strain: Not on file  . Food insecurity    Worry: Not on file    Inability: Not on file  . Transportation needs    Medical: Not on file    Non-medical: Not on file  Tobacco Use  . Smoking status: Current Every Day Smoker    Packs/day: 0.50  . Smokeless tobacco: Never Used  . Tobacco comment: 8-9 cigarattes/day  Substance and Sexual Activity  . Alcohol use: Yes    Comment: occ wine  . Drug use: No  . Sexual activity: Not on file  Lifestyle  . Physical activity    Days per week: Not on file    Minutes per session: Not  on file  . Stress: Not on file  Relationships  . Social Herbalist on phone: Not on file    Gets together: Not on file    Attends religious service: Not on file    Active member of club or organization: Not on file    Attends meetings of clubs or organizations: Not on file    Relationship status: Not on file  . Intimate partner violence    Fear of current or ex partner: Not on file    Emotionally abused: Not on file    Physically abused: Not on file    Forced sexual activity: Not on file  Other Topics Concern  . Not on file  Social History Narrative   Lives at home with her son   Right handed    Caffeine: 1 cup of tea daily    Family History  Problem Relation Age of Onset  . Diabetes Brother   . Migraines Neg Hx   . Headache Neg Hx     Past Medical History:  Diagnosis Date  . Arthritis     Patient Active Problem List   Diagnosis Date Noted  . Occipital neuralgia of left side 02/01/2019    Past Surgical History:  Procedure Laterality Date  . fistula track removal     rectal  . PARTIAL HYSTERECTOMY      Current Outpatient Medications  Medication Sig Dispense Refill  . zolpidem (AMBIEN) 10 MG tablet Take 10 mg by mouth at bedtime.    Marland Kitchen amLODipine-benazepril (LOTREL) 5-10 MG capsule Take 1 capsule by mouth daily.    Marland Kitchen tiZANidine (ZANAFLEX) 4 MG tablet Take 1 tablet (4 mg total) by mouth every 6 (six) hours as needed for muscle spasms. 60 tablet 3   No current facility-administered medications for this visit.     Allergies as of 01/31/2019 - Review Complete 01/31/2019  Allergen Reaction Noted  . Aspirin  01/31/2019  . Latex Nausea Only 03/21/2016  . Lexapro [escitalopram] Diarrhea 03/21/2016  . Penicillins Itching 03/21/2016    Vitals: BP 118/88 (BP Location: Right Arm, Patient Position: Sitting)   Pulse 76   Temp 97.6 F (36.4 C) Comment: taken at front door  Ht 5\' 6"  (1.676 m)   Wt 142 lb (64.4 kg)   BMI 22.92 kg/m  Last Weight:  Wt Readings from Last 1 Encounters:  01/31/19 142 lb (64.4 kg)   Last Height:   Ht Readings from Last 1 Encounters:  01/31/19 5\' 6"  (1.676 m)     Physical exam: Exam: Gen: NAD, conversant, well nourised, well groomed                     CV: RRR, no MRG. No Carotid Bruits. No peripheral edema, warm, nontender Eyes: Conjunctivae clear without exudates or hemorrhage  Neuro: Detailed Neurologic Exam  Speech:    Speech is normal; fluent and spontaneous with normal comprehension.  Cognition:    The patient is oriented to person, place, and time;     recent and remote memory intact;     language fluent;     normal  attention, concentration,     fund of knowledge Cranial Nerves:    The pupils are equal, round, and reactive to light.attempted fundoscopy, pupils too small. isual fields are full to finger confrontation. Extraocular movements are intact. Trigeminal sensation is intact and the muscles of mastication are normal. The face is symmetric. The palate elevates in the midline. Hearing intact.  Voice is normal. Shoulder shrug is normal. The tongue has normal motion without fasciculations.   Coordination:    No dysmetria  Gait:Normal native gait  Motor Observation:    No asymmetry, no atrophy, and no involuntary movements noted. Tone:    Normal muscle tone.    Posture:    Posture is normal. normal erect    Strength:    Strength is V/V in the upper and lower limbs.      Sensation: intact to LT     Reflex Exam:  DTR's:    Deep tendon reflexes in the upper and lower extremities are symmetrical bilaterally.   Toes:    The toes are downgoing bilaterally.   Clonus:    Clonus is absent.    Assessment/Plan:  68 year old female with new onset left occipital headache, occipital neuralgia in the setting of very tight left cervical muscles likely compressing the occipital nerve. Neuro exam in non focal other than pain on palpation at occipital area; Pain is  located in the distribution of the greater, lesser and/or third occipital nerves, paroxysmal and brief, painful, sharp, with tenderness and trigger points at the emergence of the greater occipital nerve.   Trigger point injections trapezius and paraspinals and occipitalis today Try Tizanidine, heat, new cervical pillow, topical agents If not improving can email and we will try Gabapentin  Performed by Dr. Jaynee Eagles M.D.  All procedures a documented blood were medically necessary, reasonable and appropriate based on the patient's history, medical diagnosis and physician opinion. Verbal informed consent was obtained from the patient, patient was informed  of potential risk of procedure, including bruising, bleeding, hematoma formation, infection, muscle weakness, muscle pain, numbness, transient hypertension, transient hyperglycemia and transient insomnia among others. All areas injected were topically clean with isopropyl rubbing alcohol. Nonsterile nonlatex gloves were worn during the procedure.  20553: 3 levels cervical paraspinals left, occipitalis left, trapezius left, levator scapulae left   No orders of the defined types were placed in this encounter.  Meds ordered this encounter  Medications  . tiZANidine (ZANAFLEX) 4 MG tablet    Sig: Take 1 tablet (4 mg total) by mouth every 6 (six) hours as needed for muscle spasms.    Dispense:  60 tablet    Refill:  3    Cc:   Leeroy Cha, MD  Sarina Ill, MD  [facility] [address] [place], [place] [address]  Phone [phone] Fax [phone]

## 2019-02-01 ENCOUNTER — Encounter: Payer: Self-pay | Admitting: Neurology

## 2019-02-01 DIAGNOSIS — M5481 Occipital neuralgia: Secondary | ICD-10-CM | POA: Insufficient documentation

## 2019-02-07 ENCOUNTER — Other Ambulatory Visit: Payer: Self-pay | Admitting: Neurology

## 2019-05-09 ENCOUNTER — Other Ambulatory Visit: Payer: Self-pay

## 2019-05-09 ENCOUNTER — Ambulatory Visit: Payer: Medicare Other | Admitting: Neurology

## 2019-05-09 ENCOUNTER — Encounter: Payer: Self-pay | Admitting: Neurology

## 2019-05-09 VITALS — BP 138/86 | HR 76 | Temp 97.2°F | Ht 66.0 in | Wt 142.0 lb

## 2019-05-09 DIAGNOSIS — M7918 Myalgia, other site: Secondary | ICD-10-CM

## 2019-05-09 DIAGNOSIS — M5481 Occipital neuralgia: Secondary | ICD-10-CM | POA: Diagnosis not present

## 2019-05-09 DIAGNOSIS — R519 Headache, unspecified: Secondary | ICD-10-CM | POA: Diagnosis not present

## 2019-05-09 NOTE — Progress Notes (Signed)
Nerve block w/o steroid: Pt signed consent  0.5% Bupivocaine 3 mL LOT: [id number] EXP: 11/21 NDC: [id number]  2% Lidocaine 3 mL LOT: [id number] EXP: 10/11/2019 NDC: [id number]

## 2019-05-09 NOTE — Progress Notes (Signed)
[facility]    Provider:  Dr Jaynee Eagles Requesting Provider: Leeroy Cha,* Emergency Room Primary Care Provider:  Leeroy Cha, MD  CC:  Occipital neuralgia  Interval history 05/09/2019: Patient here for follow up, she has been seen in the past for left-sided occipital headaches and very tight cervical muscles, thought to be occipital compression due to hypertrophied and tight cervical muscles or myofascial pain syndrome, we performed trigger point injections in the past on the left side which improved her symptoms, today she is back for similar symptoms this time on the right side. This has been going on for several months, she tried the muscle relaxers. We discussed trigger point injections, also dry needling in PT and I suggested she go to PT and have therapy and dry needling. She says the left side was significantly improved and long lasting. No other focal neurologic deficits, associated symptoms, inciting events or modifiable factors.  HPI:  Christina Sanchez is a 69 y.o. female here as requested by the emergency room for headaches. In July she hit her head, she bent down and hit her head on the corner, she "saw stars", no LOC, no vomiting. The headache comes and goes. The pain is in the occipital area and shoots up the back of the neck. It comes and goes, it makes her vision blurry it is so painful, severe, she has a lot of tightness in the shoulders, just on the left, started after she hit her head, it can come and go and happens every few days, severe, shooting a burning, will stay for a few days and then ease off. She takes alleve and that helps, worse with pain in the muscles on the left. Stiffness on the left in the muscles, but no significant neck pain.No other focal neurologic deficits, associated symptoms, inciting events or modifiable factors.  Reviewed notes, labs and imaging from outside physicians, which showed:  I reviewed notes from the  emergency room earlier this month January 22, 2019.  She presented for headache.  She hit her head when she bent down in her bathroom approximately a month prior, struck her head on the bathroom vanity, did not lose consciousness, ever since experiencing headaches.  She was not able to get an appointment with her primary care.  Headaches persistent only partially relieved with Aleve.  She now has pain on both sides of the neck and has been having intermittent blurry vision.  Also noticed pain in the left side of her head as well as blurred vision to the left side which is new show she was seen in the emergency room.  Looks like she was given Flexeril and tramadol.  Reported current every day smoker.  No focal deficits.  They questioned analgesic rebound headache.  Exam normal.  Neck pain consistent with Teston, muscle pain.  CT of the head was normal.    Ct showed No acute intracranial abnormalities including mass lesion or mass effect, hydrocephalus, extra-axial fluid collection, midline shift, hemorrhage, or acute infarction, large ischemic events (personally reviewed images)     Review of Systems: Patient complains of symptoms per HPI as well as the following symptoms neck tightness, headache, occipital neuralgia. Pertinent negatives and positives per HPI. All others negative.   Social History   Socioeconomic History  . Marital status: Single    Spouse name: Not on file  . Number of children: 1  . Years of education: Not on file  . Highest education level: High school graduate  Occupational History  .  Not on file  Tobacco Use  . Smoking status: Current Every Day Smoker    Packs/day: 0.50  . Smokeless tobacco: Never Used  . Tobacco comment: 8-9 cigarattes/day  Substance and Sexual Activity  . Alcohol use: Yes    Comment: occ wine  . Drug use: No  . Sexual activity: Not on file  Other Topics Concern  . Not on file  Social History Narrative   Lives at home with her son   Right  handed   Caffeine: 1 cup of tea daily   Social Determinants of Health   Financial Resource Strain:   . Difficulty of Paying Living Expenses: Not on file  Food Insecurity:   . Worried About Charity fundraiser in the Last Year: Not on file  . Ran Out of Food in the Last Year: Not on file  Transportation Needs:   . Lack of Transportation (Medical): Not on file  . Lack of Transportation (Non-Medical): Not on file  Physical Activity:   . Days of Exercise per Week: Not on file  . Minutes of Exercise per Session: Not on file  Stress:   . Feeling of Stress : Not on file  Social Connections:   . Frequency of Communication with Friends and Family: Not on file  . Frequency of Social Gatherings with Friends and Family: Not on file  . Attends Religious Services: Not on file  . Active Member of Clubs or Organizations: Not on file  . Attends Archivist Meetings: Not on file  . Marital Status: Not on file  Intimate Partner Violence:   . Fear of Current or Ex-Partner: Not on file  . Emotionally Abused: Not on file  . Physically Abused: Not on file  . Sexually Abused: Not on file    Family History  Problem Relation Age of Onset  . Diabetes Brother   . Migraines Neg Hx   . Headache Neg Hx     Past Medical History:  Diagnosis Date  . Arthritis   . Bilateral occipital neuralgia     Patient Active Problem List   Diagnosis Date Noted  . Occipital neuralgia of right side 05/09/2019  . Myofascial pain syndrome, cervical 05/09/2019  . Occipital neuralgia of left side 02/01/2019    Past Surgical History:  Procedure Laterality Date  . fistula track removal     rectal  . PARTIAL HYSTERECTOMY      Current Outpatient Medications  Medication Sig Dispense Refill  . amLODipine-benazepril (LOTREL) 5-10 MG capsule Take 1 capsule by mouth daily.    Marland Kitchen tiZANidine (ZANAFLEX) 4 MG tablet Take 1 tablet (4 mg total) by mouth every 6 (six) hours as needed for muscle spasms. 60 tablet 3   . zolpidem (AMBIEN) 10 MG tablet Take 10 mg by mouth at bedtime.     No current facility-administered medications for this visit.    Allergies as of 05/09/2019 - Review Complete 05/09/2019  Allergen Reaction Noted  . Aspirin  01/31/2019  . Latex Nausea Only 03/21/2016  . Lexapro [escitalopram] Diarrhea 03/21/2016  . Penicillins Itching 03/21/2016    Vitals: BP 138/86 (BP Location: Right Arm, Patient Position: Sitting)   Pulse 76   Temp (!) 97.2 F (36.2 C) Comment: taken at front  Ht 5\' 6"  (1.676 m)   Wt 142 lb (64.4 kg)   BMI 22.92 kg/m  Last Weight:  Wt Readings from Last 1 Encounters:  05/09/19 142 lb (64.4 kg)   Last Height:   Ht  Readings from Last 1 Encounters:  05/09/19 5\' 6"  (1.676 m)     Physical exam: Exam: Gen: NAD, conversant, well nourised, well groomed                     CV: RRR, no MRG. No Carotid Bruits. No peripheral edema, warm, nontender Eyes: Conjunctivae clear without exudates or hemorrhage  Neuro: Detailed Neurologic Exam  Speech:    Speech is normal; fluent and spontaneous with normal comprehension.  Cognition:    The patient is oriented to person, place, and time;     recent and remote memory intact;     language fluent;     normal attention, concentration,     fund of knowledge Cranial Nerves:    The pupils are equal, round, and reactive to light.attempted fundoscopy, pupils too small. isual fields are full to finger confrontation. Extraocular movements are intact. Trigeminal sensation is intact and the muscles of mastication are normal. The face is symmetric. The palate elevates in the midline. Hearing intact. Voice is normal. Shoulder shrug is normal. The tongue has normal motion without fasciculations.   Coordination:    No dysmetria  Gait:Normal native gait  Motor Observation:    No asymmetry, no atrophy, and no involuntary movements noted. Tone:    Normal muscle tone.    Posture:    Posture is normal. normal erect     Strength:    Strength is V/V in the upper and lower limbs.      Sensation: intact to LT     Reflex Exam:  DTR's:    Deep tendon reflexes in the upper and lower extremities are symmetrical bilaterally.   Toes:    The toes are downgoing bilaterally.   Clonus:    Clonus is absent.    Assessment/Plan:  69 year old female with new onset left occipital headache, occipital neuralgia in the setting of very tight left cervical muscles likely compressing the occipital nerve. Neuro exam in non focal other than pain on palpation at occipital area; Pain is  located in the distribution of the greater, lesser and/or third occipital nerves, paroxysmal and brief, painful, sharp, with tenderness and trigger points at the emergence of the greater occipital nerve.   Trigger point injections trapezius and paraspinals and occipitalis in the past which helped the left Trapezius and occipitalis but she is now here for similar right-sided symptoms. We will perform the same on the right today, also discussed PT and dry needling.   Physical therapy: This lovely female also reports that her cervical muscles are so tight that she not only has occipital nerve pain but also feels difficulty with mobility in both her arms, it bothers her significantly, I recommend she go to physical therapy with an emphasis on dry needling but also stretching, learning exercises that will help with her myofascial neck pain and very tight trap muscles, posture training and any other modality as clinically warranted by physical therapies evaluation.  Cont Tizanidine, heat, new cervical pillow, topical agents.If not improving can email and we will try Gabapentin  Performed by Dr. Jaynee Eagles M.D.  All procedures a documented blood were medically necessary, reasonable and appropriate based on the patient's history, medical diagnosis and physician opinion. Verbal informed consent was obtained from the patient, patient was informed of potential risk of  procedure, including bruising, bleeding, hematoma formation, infection, muscle weakness, muscle pain, numbness, transient hypertension, transient hyperglycemia and transient insomnia among others. All areas injected were topically clean with  isopropyl rubbing alcohol. Nonsterile nonlatex gloves were worn during the procedure.  20553: 3 levels cervical paraspinals right, occipitalis right, trapezius right, levator scapulae right    A total of 25 minutes was spent on this patient's care, reviewing imaging, past records, recent hospitalization notes and results. Over half this time was spent on counseling patient on the  1. Occipital neuralgia of right side   2. Occipital headache   3. Myofascial pain syndrome, cervical   4. Occipital neuralgia of left side    diagnosis and different diagnostic and therapeutic options, counseling and coordination of care, risks and benefitsof management, compliance, or risk factor reduction and education.  This does not include time spent on procedure.    Cc:   Leeroy Cha, MD  Sarina Ill, MD  [facility] [address] [place], [place] [address]  Phone [phone] Fax [phone]

## 2019-05-09 NOTE — Patient Instructions (Signed)
Occipital Nerve Block Patient Information  Description: The occipital nerves originate in the cervical (neck) spinal cord and travel upward through muscle and tissue to supply sensation to the back of the head and top of the scalp.  In addition, the nerves control some of the muscles of the scalp.  Occipital neuralgia is an irritation of these nerves which can cause headaches, numbness of the scalp, and neck discomfort.     The occipital nerve block will interrupt nerve transmission through these nerves and can relieve pain and spasm.  The block consists of insertion of a small needle under the skin in the back of the head to deposit local anesthetic (numbing medicine) and/or steroids around the nerve.  The entire block usually lasts less than 5 minutes.  Conditions which may be treated by occipital blocks:   Muscular pain and spasm of the scalp  Nerve irritation, back of the head  Headaches  Upper neck pain  Preparation for the injection:  1. Do not eat any solid food or dairy products within 8 hours of your appointment. 2. You may drink clear liquids up to 3 hours before appointment.  Clear liquids include water, black coffee, juice or soda.  No milk or cream please. 3. You may take your regular medication, including pain medications, with a sip of water before you appointment.  Diabetics should hold regular insulin (if taken separately) and take 1/2 normal NPH dose the morning of the procedure.  Carry some sugar containing items with you to your appointment. 4. A driver must accompany you and be prepared to drive you home after your procedure. 5. Bring all your current medications with you. 6. An IV may be inserted and sedation may be given at the discretion of the physician. 7. A blood pressure cuff, EKG, and other monitors will often be applied during the procedure.  Some patients may need to have extra oxygen administered for a short period. 8. You will be asked to provide medical  information, including your allergies and medications, prior to the procedure.  We must know immediately if you are taking blood thinners (like Coumadin/Warfarin) or if you are allergic to IV iodine contrast (dye).  We must know if you could possible be pregnant.  9. Do not wear a high collared shirt or turtleneck.  Tie long hair up in the back if possible.  Possible side-effects:   Bleeding from needle site  Infection (rare, may require surgery)  Nerve injury (rare)  Hair on back of neck can be tinged with iodine scrub (this will wash out)  Light-headedness (temporary)  Pain at injection site (several days)  Decreased blood pressure (rare, temporary)  Seizure (very rare)  Call if you experience:   Hives or difficulty breathing ( go to the emergency room)  Inflammation or drainage at the injection site(s)  Please note:  Although the local anesthetic injected can often make your painful muscles or headache feel good for several hours after the injection, the pain may return.  It takes 3-7 days for steroids to work.  You may not notice any pain relief for at least one week.  If effective, we will often do a series of injections spaced 3-6 weeks apart to maximally decrease your pain.  If you have any questions, please call [phone] [facility] Occipital Neuralgia  Occipital neuralgia is a type of headache that causes brief episodes of very bad pain in the back of your head. Pain from occipital  neuralgia may spread (radiate) to other parts of your head. These headaches may be caused by irritation of the nerves that leave your spinal cord high up in your neck, just below the base of your skull (occipital nerves). Your occipital nerves transmit sensations from the back of your head, the top of your head, and the areas behind your ears. What are the causes? This condition can occur without any known cause (primary headache syndrome). In other  cases, this condition is caused by pressure on or irritation of one of the two occipital nerves. Pressure and irritation may be due to:  Muscle spasm in the neck.  Neck injury.  Wear and tear of the vertebrae in the neck (osteoarthritis).  Disease of the disks that separate the vertebrae.  Swollen blood vessels that put pressure on the occipital nerves.  Infections.  Tumors.  Diabetes. What are the signs or symptoms? This condition causes brief burning, stabbing, electric, shocking, or shooting pain which can radiate to the top of the head. It can happen on one side or both sides of the head. It can also cause:  Pain behind the eye.  Pain triggered by neck movement or hair brushing.  Scalp tenderness.  Aching in the back of the head between episodes of very bad pain.  Pain gets worse with exposure to bright lights. How is this diagnosed? There is no test that diagnoses this condition. Your health care provider may diagnose this condition based on a physical exam and your symptoms. Other tests may be done, such as:  Imaging studies of the brain and neck (cervical spine), such as an MRI or CT scan. These look for causes of pinched nerves.  Applying pressure to the nerves in the neck to try to re-create the pain.  Injection of numbing medicine into the occipital nerve areas to see if pain goes away (diagnostic nerve block). How is this treated? Treatment for this condition may begin with simple measures, such as:  Rest.  Massage.  Applying heat or cold on the area.  Over-the-counter pain relievers. If these measures do not work, you may need other treatments, including:  Medicines, such as: ? Prescription-strength anti-inflammatory medicines. ? Muscle relaxants. ? Anti-seizure medicines, which can relieve pain. ? Antidepressants, which can relieve pain. ? Injected medicines, such as medicines that numb the area (local anesthetic) and steroids.  Pulsed  radiofrequency ablation. This is when wires are implanted to deliver electrical impulses that block pain signals from the occipital nerve.  Surgery to relieve nerve pressure.  Physical therapy. Follow these instructions at home: Pain management      Avoid any activities that cause pain.  Rest when you have an attack of pain.  Try gentle massage to relieve pain.  Try a different pillow or sleeping position.  If directed, apply heat to the affected area as told by your health care provider. Use the heat source that your health care provider recommends, such as a moist heat pack or a heating pad. ? Place a towel between your skin and the heat source. ? Leave the heat on for 20-30 minutes. ? Remove the heat if your skin turns bright red. This is especially important if you are unable to feel pain, heat, or cold. You may have a greater risk of getting burned.  If directed, apply ice to the back of the head and neck area as told by your health care provider. ? Put ice in a plastic bag. ? Place a towel between  your skin and the bag. ? Leave the ice on for 20 minutes, 2-3 times per day. General instructions  Take over-the-counter and prescription medicines only as told by your health care provider.  Avoid things that make your symptoms worse, such as bright lights.  Try to stay active. Get regular exercise that does not cause pain. Ask your health care provider to suggest safe exercises for you.  Work with a physical therapist to learn stretching exercises you can do at home.  Practice good posture.  Keep all follow-up visits as told by your health care provider. This is important. Contact a health care provider if:  Your medicine is not working.  You have new or worsening symptoms. Get help right away if:  You have very bad head pain that does not go away.  You have a sudden change in vision, balance, or speech. Summary  Occipital neuralgia is a type of headache that  causes brief episodes of very bad pain in the back of your head.  Pain from occipital neuralgia may spread (radiate) to other parts of your head.  Treatment for this condition includes rest, massage, and medicines. This information is not intended to replace advice given to you by your health care provider. Make sure you discuss any questions you have with your health care provider. Document Revised: 03/15/2017 Document Reviewed: 06/03/2016 Elsevier Patient Education  [place].

## 2019-05-16 ENCOUNTER — Ambulatory Visit: Payer: Medicare Other

## 2019-05-21 ENCOUNTER — Other Ambulatory Visit: Payer: Self-pay | Admitting: Internal Medicine

## 2019-05-21 DIAGNOSIS — Z122 Encounter for screening for malignant neoplasm of respiratory organs: Secondary | ICD-10-CM

## 2019-05-23 ENCOUNTER — Ambulatory Visit: Payer: Medicare Other | Admitting: Physical Therapy

## 2019-06-06 ENCOUNTER — Ambulatory Visit: Payer: Medicare Other | Attending: Neurology | Admitting: Physical Therapy

## 2019-07-16 ENCOUNTER — Other Ambulatory Visit: Payer: Self-pay | Admitting: Internal Medicine

## 2019-07-16 ENCOUNTER — Ambulatory Visit
Admission: RE | Admit: 2019-07-16 | Discharge: 2019-07-16 | Disposition: A | Discharge status: Home / Self Care | Payer: Medicare Other | Source: Ambulatory Visit | Attending: Internal Medicine | Admitting: Internal Medicine

## 2019-07-16 DIAGNOSIS — M25511 Pain in right shoulder: Secondary | ICD-10-CM

## 2020-05-06 DIAGNOSIS — H903 Sensorineural hearing loss, bilateral: Secondary | ICD-10-CM | POA: Diagnosis not present

## 2020-05-12 DIAGNOSIS — M170 Bilateral primary osteoarthritis of knee: Secondary | ICD-10-CM | POA: Diagnosis not present

## 2020-05-12 DIAGNOSIS — I10 Essential (primary) hypertension: Secondary | ICD-10-CM | POA: Diagnosis not present

## 2020-05-12 DIAGNOSIS — G4700 Insomnia, unspecified: Secondary | ICD-10-CM | POA: Diagnosis not present

## 2020-05-12 DIAGNOSIS — G8929 Other chronic pain: Secondary | ICD-10-CM | POA: Diagnosis not present

## 2020-05-30 DIAGNOSIS — G4700 Insomnia, unspecified: Secondary | ICD-10-CM | POA: Diagnosis not present

## 2020-05-30 DIAGNOSIS — I10 Essential (primary) hypertension: Secondary | ICD-10-CM | POA: Diagnosis not present

## 2020-05-30 DIAGNOSIS — M170 Bilateral primary osteoarthritis of knee: Secondary | ICD-10-CM | POA: Diagnosis not present

## 2020-05-30 DIAGNOSIS — G8929 Other chronic pain: Secondary | ICD-10-CM | POA: Diagnosis not present

## 2020-06-13 ENCOUNTER — Other Ambulatory Visit: Payer: Self-pay | Admitting: Internal Medicine

## 2020-06-13 ENCOUNTER — Ambulatory Visit
Admission: RE | Admit: 2020-06-13 | Discharge: 2020-06-13 | Disposition: A | Discharge status: Home / Self Care | Payer: Medicare Other | Source: Ambulatory Visit | Attending: Internal Medicine | Admitting: Internal Medicine

## 2020-06-13 DIAGNOSIS — S0990XA Unspecified injury of head, initial encounter: Secondary | ICD-10-CM | POA: Diagnosis not present

## 2020-06-13 DIAGNOSIS — S6291XA Unspecified fracture of right wrist and hand, initial encounter for closed fracture: Secondary | ICD-10-CM | POA: Diagnosis not present

## 2020-06-13 DIAGNOSIS — M7989 Other specified soft tissue disorders: Secondary | ICD-10-CM | POA: Diagnosis not present

## 2020-06-13 DIAGNOSIS — W19XXXA Unspecified fall, initial encounter: Secondary | ICD-10-CM

## 2020-06-16 ENCOUNTER — Other Ambulatory Visit: Payer: Self-pay | Admitting: Internal Medicine

## 2020-06-16 DIAGNOSIS — M79641 Pain in right hand: Secondary | ICD-10-CM | POA: Diagnosis not present

## 2020-06-16 DIAGNOSIS — S0990XA Unspecified injury of head, initial encounter: Secondary | ICD-10-CM

## 2020-06-18 DIAGNOSIS — M79641 Pain in right hand: Secondary | ICD-10-CM | POA: Diagnosis not present

## 2020-06-23 DIAGNOSIS — I10 Essential (primary) hypertension: Secondary | ICD-10-CM | POA: Diagnosis not present

## 2020-06-23 DIAGNOSIS — G4700 Insomnia, unspecified: Secondary | ICD-10-CM | POA: Diagnosis not present

## 2020-06-23 DIAGNOSIS — G8929 Other chronic pain: Secondary | ICD-10-CM | POA: Diagnosis not present

## 2020-06-23 DIAGNOSIS — M170 Bilateral primary osteoarthritis of knee: Secondary | ICD-10-CM | POA: Diagnosis not present

## 2020-07-16 DIAGNOSIS — M79641 Pain in right hand: Secondary | ICD-10-CM | POA: Diagnosis not present

## 2020-08-05 DIAGNOSIS — G4700 Insomnia, unspecified: Secondary | ICD-10-CM | POA: Diagnosis not present

## 2020-08-25 DIAGNOSIS — I10 Essential (primary) hypertension: Secondary | ICD-10-CM | POA: Diagnosis not present

## 2020-08-25 DIAGNOSIS — G4700 Insomnia, unspecified: Secondary | ICD-10-CM | POA: Diagnosis not present

## 2020-08-25 DIAGNOSIS — G8929 Other chronic pain: Secondary | ICD-10-CM | POA: Diagnosis not present

## 2020-08-25 DIAGNOSIS — M170 Bilateral primary osteoarthritis of knee: Secondary | ICD-10-CM | POA: Diagnosis not present

## 2020-12-08 DIAGNOSIS — G8929 Other chronic pain: Secondary | ICD-10-CM | POA: Diagnosis not present

## 2020-12-08 DIAGNOSIS — G4700 Insomnia, unspecified: Secondary | ICD-10-CM | POA: Diagnosis not present

## 2020-12-08 DIAGNOSIS — I10 Essential (primary) hypertension: Secondary | ICD-10-CM | POA: Diagnosis not present

## 2020-12-08 DIAGNOSIS — M170 Bilateral primary osteoarthritis of knee: Secondary | ICD-10-CM | POA: Diagnosis not present

## 2020-12-10 DIAGNOSIS — G4700 Insomnia, unspecified: Secondary | ICD-10-CM | POA: Diagnosis not present

## 2021-03-26 ENCOUNTER — Other Ambulatory Visit: Payer: Self-pay | Admitting: Family Medicine

## 2021-03-26 DIAGNOSIS — H903 Sensorineural hearing loss, bilateral: Secondary | ICD-10-CM | POA: Diagnosis not present

## 2021-03-26 DIAGNOSIS — F17210 Nicotine dependence, cigarettes, uncomplicated: Secondary | ICD-10-CM | POA: Diagnosis not present

## 2021-03-26 DIAGNOSIS — M179 Osteoarthritis of knee, unspecified: Secondary | ICD-10-CM | POA: Diagnosis not present

## 2021-03-26 DIAGNOSIS — Z23 Encounter for immunization: Secondary | ICD-10-CM | POA: Diagnosis not present

## 2021-03-26 DIAGNOSIS — R0602 Shortness of breath: Secondary | ICD-10-CM | POA: Diagnosis not present

## 2021-03-26 DIAGNOSIS — R634 Abnormal weight loss: Secondary | ICD-10-CM | POA: Diagnosis not present

## 2021-03-26 DIAGNOSIS — G4700 Insomnia, unspecified: Secondary | ICD-10-CM | POA: Diagnosis not present

## 2021-03-26 DIAGNOSIS — I10 Essential (primary) hypertension: Secondary | ICD-10-CM | POA: Diagnosis not present

## 2021-03-26 DIAGNOSIS — Z79899 Other long term (current) drug therapy: Secondary | ICD-10-CM | POA: Diagnosis not present

## 2021-03-26 DIAGNOSIS — R002 Palpitations: Secondary | ICD-10-CM | POA: Diagnosis not present

## 2021-03-26 DIAGNOSIS — Z1159 Encounter for screening for other viral diseases: Secondary | ICD-10-CM | POA: Diagnosis not present

## 2021-03-26 DIAGNOSIS — Z122 Encounter for screening for malignant neoplasm of respiratory organs: Secondary | ICD-10-CM

## 2021-04-24 ENCOUNTER — Inpatient Hospital Stay: Admission: RE | Admit: 2021-04-24 | Payer: Medicare Other | Source: Ambulatory Visit

## 2021-04-24 ENCOUNTER — Other Ambulatory Visit: Payer: Self-pay | Admitting: Family Medicine

## 2021-04-24 DIAGNOSIS — Z1231 Encounter for screening mammogram for malignant neoplasm of breast: Secondary | ICD-10-CM

## 2021-04-24 DIAGNOSIS — F17210 Nicotine dependence, cigarettes, uncomplicated: Secondary | ICD-10-CM

## 2021-04-24 DIAGNOSIS — Z13820 Encounter for screening for osteoporosis: Secondary | ICD-10-CM

## 2021-04-24 DIAGNOSIS — Z122 Encounter for screening for malignant neoplasm of respiratory organs: Secondary | ICD-10-CM

## 2021-04-24 DIAGNOSIS — R0789 Other chest pain: Secondary | ICD-10-CM

## 2021-04-29 ENCOUNTER — Other Ambulatory Visit: Payer: Self-pay | Admitting: Family Medicine

## 2021-04-29 DIAGNOSIS — M79645 Pain in left finger(s): Secondary | ICD-10-CM

## 2021-05-12 ENCOUNTER — Other Ambulatory Visit: Payer: Self-pay

## 2021-05-12 ENCOUNTER — Ambulatory Visit
Admission: RE | Admit: 2021-05-12 | Discharge: 2021-05-12 | Disposition: A | Discharge status: Home / Self Care | Payer: Medicare Other | Source: Ambulatory Visit | Attending: Family Medicine | Admitting: Family Medicine

## 2021-05-12 DIAGNOSIS — Z13820 Encounter for screening for osteoporosis: Secondary | ICD-10-CM

## 2021-05-12 DIAGNOSIS — Z1231 Encounter for screening mammogram for malignant neoplasm of breast: Secondary | ICD-10-CM

## 2021-05-13 DIAGNOSIS — J432 Centrilobular emphysema: Secondary | ICD-10-CM

## 2021-05-13 HISTORY — DX: Centrilobular emphysema: J43.2

## 2021-05-14 ENCOUNTER — Ambulatory Visit
Admission: RE | Admit: 2021-05-14 | Discharge: 2021-05-14 | Disposition: A | Discharge status: Home / Self Care | Payer: Medicare Other | Source: Ambulatory Visit | Attending: Family Medicine | Admitting: Family Medicine

## 2021-05-14 ENCOUNTER — Other Ambulatory Visit: Payer: Self-pay

## 2021-05-14 ENCOUNTER — Other Ambulatory Visit: Payer: Self-pay | Admitting: Family Medicine

## 2021-05-14 DIAGNOSIS — R0789 Other chest pain: Secondary | ICD-10-CM

## 2021-05-14 DIAGNOSIS — R928 Other abnormal and inconclusive findings on diagnostic imaging of breast: Secondary | ICD-10-CM

## 2021-05-14 DIAGNOSIS — Z122 Encounter for screening for malignant neoplasm of respiratory organs: Secondary | ICD-10-CM

## 2021-05-14 DIAGNOSIS — M79645 Pain in left finger(s): Secondary | ICD-10-CM

## 2021-05-23 ENCOUNTER — Ambulatory Visit
Admission: RE | Admit: 2021-05-23 | Discharge: 2021-05-23 | Disposition: A | Discharge status: Home / Self Care | Payer: Medicare Other | Source: Ambulatory Visit | Attending: Family Medicine | Admitting: Family Medicine

## 2021-05-23 ENCOUNTER — Other Ambulatory Visit: Payer: Self-pay | Admitting: Family Medicine

## 2021-05-23 DIAGNOSIS — R928 Other abnormal and inconclusive findings on diagnostic imaging of breast: Secondary | ICD-10-CM

## 2021-05-23 DIAGNOSIS — N6320 Unspecified lump in the left breast, unspecified quadrant: Secondary | ICD-10-CM

## 2021-06-04 ENCOUNTER — Ambulatory Visit
Admission: RE | Admit: 2021-06-04 | Discharge: 2021-06-04 | Disposition: A | Discharge status: Home / Self Care | Payer: Medicare Other | Source: Ambulatory Visit | Attending: Family Medicine | Admitting: Family Medicine

## 2021-06-04 DIAGNOSIS — N6320 Unspecified lump in the left breast, unspecified quadrant: Secondary | ICD-10-CM

## 2021-06-08 ENCOUNTER — Encounter: Payer: Self-pay | Admitting: *Deleted

## 2021-06-08 ENCOUNTER — Telehealth: Payer: Self-pay | Admitting: Hematology

## 2021-06-08 ENCOUNTER — Other Ambulatory Visit: Payer: Medicare Other

## 2021-06-08 ENCOUNTER — Other Ambulatory Visit: Payer: Self-pay | Admitting: *Deleted

## 2021-06-08 DIAGNOSIS — C50412 Malignant neoplasm of upper-outer quadrant of left female breast: Secondary | ICD-10-CM | POA: Insufficient documentation

## 2021-06-08 DIAGNOSIS — Z170 Estrogen receptor positive status [ER+]: Secondary | ICD-10-CM

## 2021-06-08 NOTE — Progress Notes (Signed)
Radiation Oncology         [phone] ________________________________  Name: Christina Sanchez        MRN: [id number]  Date of Service: 06/10/2021 DOB: 1950-07-24  KD:[id number], [name], MD  Erroll Luna, MD     REFERRING PHYSICIAN: Erroll Luna, MD   DIAGNOSIS: The encounter diagnosis was Malignant neoplasm of upper-outer quadrant of left breast in female, estrogen receptor positive ([place]).   HISTORY OF PRESENT ILLNESS: Christina Sanchez is a 71 y.o. female seen in the multidisciplinary breast clinic for a new diagnosis of left breast cancer. The patient was noted to have screening detected mass in the left breast.  Diagnostic imaging by ultrasound measured a mass in the 10 o'clock position up to 8 mm in greatest dimension with no evidence of left axillary adenopathy.  She underwent biopsy of this on 06/04/2021 which revealed grade 1 invasive mammary carcinoma with focal ductal and lobular carcinoma in situ, overall her phenotype is felt to be consistent with Lobular carcinoma and LCIS.  Her cancer was ER/PR positive, HER2 negative and Ki-67 was 2%.  She is seen today to discuss treatment recommendations of her cancer.    PREVIOUS RADIATION THERAPY: No   PAST MEDICAL HISTORY:  Past Medical History:  Diagnosis Date   Arthritis    Bilateral occipital neuralgia        PAST SURGICAL HISTORY: Past Surgical History:  Procedure Laterality Date   fistula track removal     rectal   PARTIAL HYSTERECTOMY       FAMILY HISTORY:  Family History  Problem Relation Age of Onset   Diabetes Brother    Migraines Neg Hx    Headache Neg Hx    Breast cancer Neg Hx      SOCIAL HISTORY:  reports that she has been smoking. She has been smoking an average of .5 packs per day. She has never used smokeless tobacco. She reports current alcohol use. She reports that she does not use drugs. The patient is single and lives in [place]. She is retired and is accompanied by her son. She  enjoys watching news programs and spending time with family.    ALLERGIES: Aspirin, Latex, Lexapro [escitalopram], and Penicillins   MEDICATIONS:  Current Outpatient Medications  Medication Sig Dispense Refill   amLODipine-benazepril (LOTREL) 5-10 MG capsule Take 1 capsule by mouth daily.     tiZANidine (ZANAFLEX) 4 MG tablet Take 1 tablet (4 mg total) by mouth every 6 (six) hours as needed for muscle spasms. 60 tablet 3   zolpidem (AMBIEN) 10 MG tablet Take 10 mg by mouth at bedtime.     No current facility-administered medications for this visit.     REVIEW OF SYSTEMS: On review of systems, the patient reports that she is doing well overall. No specific breast complaints are noted.      PHYSICAL EXAM:  Wt Readings from Last 3 Encounters:  05/09/19 142 lb (64.4 kg)  01/31/19 142 lb (64.4 kg)  03/21/16 141 lb (64 kg)   Temp Readings from Last 3 Encounters:  05/09/19 (!) 97.2 F (36.2 C)  01/31/19 97.6 F (36.4 C)  01/23/19 98.4 F (36.9 C)   BP Readings from Last 3 Encounters:  05/09/19 138/86  01/31/19 118/88  01/23/19 (!) 168/68   Pulse Readings from Last 3 Encounters:  05/09/19 76  01/31/19 76  01/23/19 (!) 56    In general this is a well appearing African American female in no acute distress. She's alert  and oriented x4 and appropriate throughout the examination. Cardiopulmonary assessment is negative for acute distress and she exhibits normal effort. Bilateral breast exam is deferred.    ECOG = 0  0 - Asymptomatic (Fully active, able to carry on all predisease activities without restriction)  1 - Symptomatic but completely ambulatory (Restricted in physically strenuous activity but ambulatory and able to carry out work of a light or sedentary nature. For example, light housework, office work)  2 - Symptomatic, <50% in bed during the day (Ambulatory and capable of all self care but unable to carry out any work activities. Up and about more than 50% of  waking hours)  3 - Symptomatic, >50% in bed, but not bedbound (Capable of only limited self-care, confined to bed or chair 50% or more of waking hours)  4 - Bedbound (Completely disabled. Cannot carry on any self-care. Totally confined to bed or chair)  5 - Death   Eustace Pen MM, Creech RH, Tormey DC, et al. [phone]). "Toxicity and response criteria of the [facility]". [place] Oncol. 5 (6): 649-55    LABORATORY DATA:  Lab Results  Component Value Date   WBC 6.9 03/21/2016   HGB 13.7 03/21/2016   HCT 39.8 03/21/2016   MCV 95.9 03/21/2016   PLT 251 03/21/2016   Lab Results  Component Value Date   NA 136 03/21/2016   K 4.2 03/21/2016   CL 103 03/21/2016   CO2 25 03/21/2016   No results found for: ALT, AST, GGT, ALKPHOS, BILITOT    RADIOGRAPHY: DG Chest 2 View  Result Date: 05/15/2021 CLINICAL DATA:  Chest wall pain. EXAM: CHEST - 2 VIEW COMPARISON:  December 31, 2017 FINDINGS: Platelike opacity in left retrocardiac region noted to represent atelectasis or scar on a CT scan from earlier today. No nodules or masses. Hyperinflation of the lungs. The cardiomediastinal silhouette is stable. No other abnormalities. IMPRESSION: Hyperinflation of the lungs suggesting COPD given history of smoking. Atelectasis or scar in the left retrocardiac region. Electronically Signed   By: Dorise Bullion III M.D.   On: 05/15/2021 19:06   DG Finger Little Left  Result Date: 05/14/2021 CLINICAL DATA:  Left little finger painful swelling close to metacarpophalangeal joint. Knot near metacarpophalangeal joint of little finger for 2-3 years. EXAM: LEFT LITTLE FINGER 2+V COMPARISON:  None. FINDINGS: Mildly decreased bone mineralization. Moderate PIP and DIP joint space narrowing of 5th finger. Mild metacarpophalangeal joint space narrowing of 5th finger. No acute fracture is seen. No dislocation. 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 No acute fracture is seen. Electronically Signed   By: Yvonne Kendall M.D.   On: 05/14/2021 19:10   US BREAST LTD UNI LEFT INC AXILLA  Addendum Date: 06/04/2021   ADDENDUM REPORT: 06/04/2021 09:44 ADDENDUM: Correction: The mass in the LEFT breast is at the 2 o'clock position 2 cm from the nipple. Electronically Signed   By: Evangeline Dakin M.D.   On: 06/04/2021 09:44   Result Date: 06/04/2021 CLINICAL DATA:  Recall from screening mammography, possible focal asymmetry involving the outer LEFT breast at middle depth. EXAM: DIGITAL DIAGNOSTIC UNILATERAL LEFT MAMMOGRAM WITH TOMOSYNTHESIS AND CAD; ULTRASOUND LEFT BREAST LIMITED TECHNIQUE: Left digital diagnostic mammography and breast tomosynthesis was performed. The images were evaluated with computer-aided detection.; Targeted ultrasound examination of the left breast was performed. COMPARISON:  Previous exam(s). ACR Breast Density Category c: The breast tissue is heterogeneously dense, which may obscure small masses.  FINDINGS: Spot-compression CC and MLO views of the area of concern were obtained. The focal asymmetry in the outer breast at middle depth persists on the spot compression views. There is no underlying architectural distortion or suspicious calcifications. Targeted ultrasound is performed, demonstrating an irregular hypoechoic mass with angular margins at the 10 o'clock position 2 cm from nipple measuring approximately 8 x 5 x 8 mm, demonstrating posterior acoustic shadowing and demonstrating peripheral power Doppler flow, located within dense fibroglandular tissue, corresponding to the screening mammographic finding. Sonographic evaluation of the LEFT axilla demonstrates no pathologic lymphadenopathy. On correlative physical examination, there is vague palpable firmness in the upper-outer periareolar location at the site of the sonographic mass. IMPRESSION: 1. Highly suspicious 8 mm mass in the UPPER OUTER QUADRANT  of the LEFT breast at the 10 o'clock position 2 cm from nipple. 2. No pathologic LEFT axillary lymphadenopathy. RECOMMENDATION: Ultrasound-guided core needle biopsy of the suspicious LEFT breast mass. I have discussed the findings and recommendations with the patient. The ultrasound core needle biopsy procedure was discussed with the patient and her questions were answered. She wishes to proceed with the biopsy which has been scheduled by the Breast Imaging staff. BI-RADS CATEGORY  5: Highly suggestive of malignancy. Electronically Signed: By: Evangeline Dakin M.D. On: 05/23/2021 08:32  MM DIAG BREAST TOMO UNI LEFT  Addendum Date: 06/04/2021   ADDENDUM REPORT: 06/04/2021 09:44 ADDENDUM: Correction: The mass in the LEFT breast is at the 2 o'clock position 2 cm from the nipple. Electronically Signed   By: Evangeline Dakin M.D.   On: 06/04/2021 09:44   Result Date: 06/04/2021 CLINICAL DATA:  Recall from screening mammography, possible focal asymmetry involving the outer LEFT breast at middle depth. EXAM: DIGITAL DIAGNOSTIC UNILATERAL LEFT MAMMOGRAM WITH TOMOSYNTHESIS AND CAD; ULTRASOUND LEFT BREAST LIMITED TECHNIQUE: Left digital diagnostic mammography and breast tomosynthesis was performed. The images were evaluated with computer-aided detection.; Targeted ultrasound examination of the left breast was performed. COMPARISON:  Previous exam(s). ACR Breast Density Category c: The breast tissue is heterogeneously dense, which may obscure small masses. FINDINGS: Spot-compression CC and MLO views of the area of concern were obtained. The focal asymmetry in the outer breast at middle depth persists on the spot compression views. There is no underlying architectural distortion or suspicious calcifications. Targeted ultrasound is performed, demonstrating an irregular hypoechoic mass with angular margins at the 10 o'clock position 2 cm from nipple measuring approximately 8 x 5 x 8 mm, demonstrating posterior acoustic  shadowing and demonstrating peripheral power Doppler flow, located within dense fibroglandular tissue, corresponding to the screening mammographic finding. Sonographic evaluation of the LEFT axilla demonstrates no pathologic lymphadenopathy. On correlative physical examination, there is vague palpable firmness in the upper-outer periareolar location at the site of the sonographic mass. IMPRESSION: 1. Highly suspicious 8 mm mass in the UPPER OUTER QUADRANT of the LEFT breast at the 10 o'clock position 2 cm from nipple. 2. No pathologic LEFT axillary lymphadenopathy. RECOMMENDATION: Ultrasound-guided core needle biopsy of the suspicious LEFT breast mass. I have discussed the findings and recommendations with the patient. The ultrasound core needle biopsy procedure was discussed with the patient and her questions were answered. She wishes to proceed with the biopsy which has been scheduled by the Breast Imaging staff. BI-RADS CATEGORY  5: Highly suggestive of malignancy. Electronically Signed: By: Evangeline Dakin M.D. On: 05/23/2021 08:32  MM 3D SCREEN BREAST BILATERAL  Result Date: 05/13/2021 CLINICAL DATA:  Screening. EXAM: DIGITAL SCREENING BILATERAL MAMMOGRAM WITH TOMOSYNTHESIS AND CAD  TECHNIQUE: Bilateral screening digital craniocaudal and mediolateral oblique mammograms were obtained. Bilateral screening digital breast tomosynthesis was performed. The images were evaluated with computer-aided detection. COMPARISON:  Previous exam(s). ACR Breast Density Category c: The breast tissue is heterogeneously dense, which may obscure small masses. FINDINGS: In the left breast, a possible asymmetry warrants further evaluation. This possible asymmetry is seen within the upper-outer quadrant, MLO slice 33. In the right breast, no findings suspicious for malignancy. IMPRESSION: Further evaluation is suggested for possible asymmetry in the left breast. RECOMMENDATION: Diagnostic mammogram and possibly ultrasound of the  left breast. (Code:[id number]) The patient will be contacted regarding the findings, and additional imaging will be scheduled. BI-RADS CATEGORY  0: Incomplete. Need additional imaging evaluation and/or prior mammograms for comparison. Electronically Signed   By: Franki Cabot M.D.   On: 05/13/2021 10:49   CT CHEST LUNG CA SCREEN LOW DOSE W/O CM  Result Date: 05/15/2021 CLINICAL DATA:  71 year old asymptomatic female current smoker with 25 pack-year smoking history. EXAM: CT CHEST WITHOUT CONTRAST LOW-DOSE FOR LUNG CANCER SCREENING TECHNIQUE: Multidetector CT imaging of the chest was performed following the standard protocol without IV contrast. RADIATION DOSE REDUCTION: This exam was performed according to the departmental dose-optimization program which includes automated exposure control, adjustment of the mA and/or kV according to patient size and/or use of iterative reconstruction technique. COMPARISON:  None. FINDINGS: Cardiovascular: Normal heart size. No significant pericardial effusion/thickening. Atherosclerotic nonaneurysmal thoracic aorta. Normal caliber pulmonary arteries. Mediastinum/Nodes: No discrete thyroid nodules. Unremarkable esophagus. No pathologically enlarged axillary, mediastinal or hilar lymph nodes, noting limited sensitivity for the detection of hilar adenopathy on this noncontrast study. Lungs/Pleura: No pneumothorax. No pleural effusion. Mild centrilobular emphysema. Mild bandlike scarring at the dependent left greater than right lung bases. No acute consolidative airspace disease or lung masses. No significant pulmonary nodules. Subcentimeter calcified pulmonary granuloma in left lower lobe. Upper abdomen: No acute abnormality. Musculoskeletal: No aggressive appearing focal osseous lesions. Mild thoracic spondylosis. IMPRESSION: Lung-RADS 1, negative. Continue annual screening with low-dose chest CT without contrast in 12 months. Aortic Atherosclerosis (ICD10-I70.0) and Emphysema  (ICD10-J43.9). Electronically Signed   By: Ilona Sorrel M.D.   On: 05/15/2021 15:43   MM CLIP PLACEMENT LEFT  Result Date: 06/04/2021 CLINICAL DATA:  Patient status post ultrasound-guided biopsy left breast mass. EXAM: 3D DIAGNOSTIC LEFT MAMMOGRAM POST ULTRASOUND BIOPSY COMPARISON:  Previous exam(s). FINDINGS: 3D Mammographic images were obtained following ultrasound guided biopsy of left breast mass. The biopsy marking clip is in expected position at the site of biopsy. IMPRESSION: Appropriate positioning of the ribbon shaped biopsy marking clip at the site of biopsy in the upper-outer left breast. Final Assessment: Post Procedure Mammograms for Marker Placement Electronically Signed   By: Lovey Newcomer M.D.   On: 06/04/2021 09:46  [place] LT BREAST BX W LOC DEV 1ST LESION IMG BX SPEC US GUIDE  Result Date: 06/04/2021 CLINICAL DATA:  Patient with indeterminate left breast mass 2 o'clock position. EXAM: ULTRASOUND GUIDED LEFT BREAST CORE NEEDLE BIOPSY COMPARISON:  Previous exam(s). PROCEDURE: I met with the patient and we discussed the procedure of ultrasound-guided biopsy, including benefits and alternatives. We discussed the high likelihood of a successful procedure. We discussed the risks of the procedure, including infection, bleeding, tissue injury, clip migration, and inadequate sampling. Informed written consent was given. The usual time-out protocol was performed immediately prior to the procedure. Lesion quadrant: Upper outer quadrant Using sterile technique and 1% Lidocaine as local anesthetic, under direct ultrasound visualization, a 14 gauge spring-loaded  device was used to perform biopsy of left breast mass 2 o'clock position using a lateral approach. At the conclusion of the procedure ribbon tissue marker clip was deployed into the biopsy cavity. Follow up 2 view mammogram was performed and dictated separately. IMPRESSION: Ultrasound guided biopsy of left breast mass 2 o'clock position. No apparent  complications. Electronically Signed   By: Lovey Newcomer M.D.   On: 06/04/2021 09:44  DG MOBILE BONE DENSITY  Result Date: 05/13/2021 CLINICAL DATA:  Postmenopausal. EXAM: DUAL X-RAY ABSORPTIOMETRY (DXA) FOR BONE MINERAL DENSITY TECHNIQUE: Bone mineral density measurements are performed of the spine, hip, and forearm, as appropriate, per International Society of Clinical Densitometry recommendations. The pertinent regions of interest are reported below. Non-contributory values are not reported. Images are obtained for bone mineral density measurement and are not obtained for diagnostic purposes. FINDINGS: AP LUMBAR SPINE L1 through L4 Bone Mineral Density (BMD):  0.915 g/cm2 Young Adult T-Score:  -2.1 Z-Score:  0.2 LEFT FEMUR NECK Bone Mineral Density (BMD):  0.766 g/cm2 Young Adult T-Score: -1.3 Z-Score:  0.1 Unit: This study was performed at [facility] on the [place] (S/N [phone]), software version 13.4.2. Scan quality: The scan quality is good. Exclusions: None. ASSESSMENT: Patient's diagnostic category is LOW BONE MASS/OSTEOPENIA by [facility] Criteria. FRACTURE RISK: INCREASED FRAX: Based on the [place] model, the 10 year probability of a major osteoporotic fracture is 6.4%. The 10 year probability of a hip fracture is 1.3%. COMPARISON: None. RECOMMENDATIONS 1. All patients should optimize calcium and vitamin D intake. 2. Consider FDA-approved medical therapies in postmenopausal women and men aged 47 years and older, based on the following: - A hip or vertebral (clinical or morphometric) fracture - T-score less than or equal to -2.5 at the femoral neck or spine after appropriate evaluation to exclude secondary causes - Low bone mass (T-score between -1.0 and -2.5 at the femoral neck or spine) and a 10-year probability of a hip fracture greater than or equal to 3% or a 10-year probability of a major osteoporosis-related fracture greater than or equal to 20% based on the  [place]-adapted WHO algorithm - Clinician judgment and/or patient preferences may indicate treatment for people with 10-year fracture probabilities above or below these levels 3. Patients with diagnosis of osteoporosis or at high risk for fracture should have regular bone mineral density tests. For patients eligible for Medicare, routine testing is allowed once every 2 years. The testing frequency can be increased to one year for patients who have rapidly progressing disease, those who are receiving or discontinuing medical therapy to restore bone mass, or have additional risk factors. Electronically Signed   By: Lajean Manes M.D.   On: 05/13/2021 09:36       IMPRESSION/PLAN: 1. 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 Dr. Lisbeth Renshaw discusses the pathology findings and reviews the nature of early stage left breast disease. The consensus from the breast conference includes breast conservation with lumpectomy. Dr. Burr Medico anticipates Oncotype Dx score to determine a role for systemic therapy. Provided that chemotherapy is not indicated, the patient's course would then be followed by external radiotherapy to the breast  to reduce risks of local recurrence followed by antiestrogen therapy. Dr. Lisbeth Renshaw also discusses cases in which radiation may be optional for favorable cases based on final pathology. We discussed the risks, benefits, short, and long term effects of radiotherapy, as well as the curative intent, and the patient is  interested in proceeding.  Dr. Lisbeth Renshaw discusses the delivery and logistics of radiotherapy and anticipates a course of 4 weeks of radiotherapy to the left breast with deep inspiration breath hold technique. We will see her back a few weeks after surgery to discuss the simulation process and anticipate we starting radiotherapy about 4-6 weeks after surgery.  2. Possible genetic predisposition to malignancy. The patient is a candidate for genetic  testing given her personal history. She will meet with genetic testing today in clinic.  In a visit lasting 60 minutes, greater than 50% of the time was spent face to face reviewing her case, as well as in preparation of, discussing, and coordinating the patient's care.  The above documentation reflects my direct findings during this shared patient visit. Please see the separate note by Dr. Lisbeth Renshaw on this date for the remainder of the patient's plan of care.    Carola Rhine, [facility]    **Disclaimer: This note was dictated with voice recognition software. Similar sounding words can inadvertently be transcribed and this note may contain transcription errors which may not have been corrected upon publication of note.**

## 2021-06-08 NOTE — Telephone Encounter (Signed)
Spoke to patient to confirm afternoon clinic for 3/1, patient will fill out paperwork when she arrives

## 2021-06-10 ENCOUNTER — Encounter: Payer: Self-pay | Admitting: *Deleted

## 2021-06-10 ENCOUNTER — Inpatient Hospital Stay (HOSPITAL_BASED_OUTPATIENT_CLINIC_OR_DEPARTMENT_OTHER): Payer: Medicare Other | Admitting: Hematology

## 2021-06-10 ENCOUNTER — Inpatient Hospital Stay: Payer: Medicare Other | Attending: Hematology

## 2021-06-10 ENCOUNTER — Encounter: Payer: Self-pay | Admitting: Physical Therapy

## 2021-06-10 ENCOUNTER — Ambulatory Visit
Admission: RE | Admit: 2021-06-10 | Discharge: 2021-06-10 | Disposition: A | Discharge status: Home / Self Care | Payer: Medicare Other | Source: Ambulatory Visit | Attending: Radiation Oncology | Admitting: Radiation Oncology

## 2021-06-10 ENCOUNTER — Telehealth: Payer: Self-pay | Admitting: Genetic Counselor

## 2021-06-10 ENCOUNTER — Encounter: Payer: Self-pay | Admitting: Hematology

## 2021-06-10 ENCOUNTER — Ambulatory Visit: Payer: Medicare Other | Attending: Surgery | Admitting: Physical Therapy

## 2021-06-10 ENCOUNTER — Ambulatory Visit: Payer: Medicare Other | Admitting: Physical Therapy

## 2021-06-10 ENCOUNTER — Ambulatory Visit: Payer: Self-pay | Admitting: Surgery

## 2021-06-10 ENCOUNTER — Other Ambulatory Visit: Payer: Self-pay

## 2021-06-10 VITALS — BP 138/77 | HR 85 | Temp 97.9°F | Resp 18 | Ht 66.0 in | Wt 126.9 lb

## 2021-06-10 DIAGNOSIS — C50412 Malignant neoplasm of upper-outer quadrant of left female breast: Secondary | ICD-10-CM | POA: Insufficient documentation

## 2021-06-10 DIAGNOSIS — R293 Abnormal posture: Secondary | ICD-10-CM | POA: Insufficient documentation

## 2021-06-10 DIAGNOSIS — F17210 Nicotine dependence, cigarettes, uncomplicated: Secondary | ICD-10-CM | POA: Diagnosis not present

## 2021-06-10 DIAGNOSIS — I10 Essential (primary) hypertension: Secondary | ICD-10-CM | POA: Diagnosis not present

## 2021-06-10 DIAGNOSIS — F32A Depression, unspecified: Secondary | ICD-10-CM | POA: Insufficient documentation

## 2021-06-10 DIAGNOSIS — Z170 Estrogen receptor positive status [ER+]: Secondary | ICD-10-CM | POA: Diagnosis present

## 2021-06-10 DIAGNOSIS — R634 Abnormal weight loss: Secondary | ICD-10-CM

## 2021-06-10 DIAGNOSIS — C50912 Malignant neoplasm of unspecified site of left female breast: Secondary | ICD-10-CM

## 2021-06-10 LAB — CMP (CANCER CENTER ONLY)
ALT: 9 U/L (ref 0–44)
AST: 14 U/L — ABNORMAL LOW (ref 15–41)
Albumin: 4.4 g/dL (ref 3.5–5.0)
Alkaline Phosphatase: 120 U/L (ref 38–126)
Anion gap: 8 (ref 5–15)
BUN: 8 mg/dL (ref 8–23)
CO2: 27 mmol/L (ref 22–32)
Calcium: 10 mg/dL (ref 8.9–10.3)
Chloride: 104 mmol/L (ref 98–111)
Creatinine: 0.71 mg/dL (ref 0.44–1.00)
GFR, Estimated: >60 mL/min (ref >60)
Glucose, Bld: 84 mg/dL (ref 70–99)
Potassium: 4.3 mmol/L (ref 3.5–5.1)
Sodium: 139 mmol/L (ref 135–145)
Total Bilirubin: 0.5 mg/dL (ref 0.3–1.2)
Total Protein: 7.9 g/dL (ref 6.5–8.1)

## 2021-06-10 LAB — CBC WITH DIFFERENTIAL (CANCER CENTER ONLY)
Abs Immature Granulocytes: 0.01 10*3/uL (ref 0.00–0.07)
Basophils Absolute: 0.1 10*3/uL (ref 0.0–0.1)
Basophils Relative: 1 %
Eosinophils Absolute: 0.2 10*3/uL (ref 0.0–0.5)
Eosinophils Relative: 3 %
HCT: 39.8 % (ref 36.0–46.0)
Hemoglobin: 13.3 g/dL (ref 12.0–15.0)
Immature Granulocytes: 0 %
Lymphocytes Relative: 35 %
Lymphs Abs: 2.3 10*3/uL (ref 0.7–4.0)
MCH: 34.6 pg — ABNORMAL HIGH (ref 26.0–34.0)
MCHC: 33.4 g/dL (ref 30.0–36.0)
MCV: 103.6 fL — ABNORMAL HIGH (ref 80.0–100.0)
Monocytes Absolute: 0.6 10*3/uL (ref 0.1–1.0)
Monocytes Relative: 9 %
Neutro Abs: 3.5 10*3/uL (ref 1.7–7.7)
Neutrophils Relative %: 52 %
Platelet Count: 381 10*3/uL (ref 150–400)
RBC: 3.84 MIL/uL — ABNORMAL LOW (ref 3.87–5.11)
RDW: 12.3 % (ref 11.5–15.5)
WBC Count: 6.7 10*3/uL (ref 4.0–10.5)
nRBC: 0 % (ref 0.0–0.2)

## 2021-06-10 LAB — GENETIC SCREENING ORDER

## 2021-06-10 NOTE — Therapy (Signed)
OUTPATIENT PHYSICAL THERAPY BREAST CANCER BASELINE EVALUATION   Patient Name: Christina Sanchez MRN: [id number] DOB:[birth year], 71 y.o., female Today's Date: 06/10/2021   PT End of Session - 06/10/21 1508     Visit Number 1    Number of Visits 2    Date for PT Re-Evaluation 08/05/21    PT Start Time 1512    PT Stop Time 1550    PT Time Calculation (min) 38 min    Activity Tolerance Patient tolerated treatment well    Behavior During Therapy [facility] for tasks assessed/performed             Past Medical History:  Diagnosis Date   Arthritis    Bilateral occipital neuralgia    Hypertension    Past Surgical History:  Procedure Laterality Date   fistula track removal     rectal   PARTIAL HYSTERECTOMY     Patient Active Problem List   Diagnosis Date Noted   Malignant neoplasm of upper-outer quadrant of left breast in female, estrogen receptor positive ([place]) 06/08/2021   Occipital neuralgia of right side 05/09/2019   Myofascial pain syndrome, cervical 05/09/2019   Occipital neuralgia of left side 02/01/2019    PCP: Leeroy Cha, MD  REFERRING PROVIDER: Erroll Luna, MD  REFERRING DIAG: Left breast cancer  THERAPY DIAG:  Malignant neoplasm of upper-outer quadrant of left breast in female, estrogen receptor positive ([place])  Abnormal posture  ONSET DATE: 06/04/2021  SUBJECTIVE                                                                                                                                                                                           SUBJECTIVE STATEMENT: Patient reports she is here today to be seen by her medical team for her newly diagnosed left breast cancer.   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 It measures 8 mm and is located in the upper outer quadrant. It is ER positive, PR and HER2 negative with a Ki67 of 2%.   PATIENT GOALS   reduce  lymphedema risk and learn post op HEP.   PAIN:  Are you having pain? Yes NPRS scale: 8/10 Pain location: hips and knees Pain orientation: Right and Left  PAIN TYPE: aching Pain description: intermittent  Aggravating factors: Walking and sitting for long periods Relieving factors: Sleeping  PRECAUTIONS: Active CA  HAND DOMINANCE: right  WEIGHT BEARING RESTRICTIONS No  FALLS:  Has patient fallen in last 6 months? No, Number of falls: 0  LIVING ENVIRONMENT: Patient lives with: son Lives in: House/apartment Has following  equipment at home: None  OCCUPATION: Retired  LEISURE: She does not exercise  PRIOR LEVEL OF FUNCTION: Independent   OBJECTIVE  COGNITION:  Overall cognitive status: Within functional limits for tasks assessed    POSTURE:  Forward head and rounded shoulders posture  UPPER EXTREMITY AROM/PROM:  A/PROM RIGHT  06/10/2021   Shoulder extension 60  Shoulder flexion 148  Shoulder abduction 145  Shoulder internal rotation 67  Shoulder external rotation 87    (Blank rows = not tested)  A/PROM LEFT  06/10/2021  Shoulder extension 53  Shoulder flexion 134  Shoulder abduction 133  Shoulder internal rotation 40  Shoulder external rotation 72    (Blank rows = not tested)   CERVICAL AROM: All within normal limits:    Percent limited  Flexion WNL  Extension 75% limited  Right lateral flexion 25% limited  Left lateral flexion 50% limited  Right rotation 75% limited  Left rotation            50% limited      UPPER EXTREMITY STRENGTH: WFL   LYMPHEDEMA ASSESSMENTS:   LANDMARK RIGHT  06/10/2021  10 cm proximal to olecranon process 22.7  Olecranon process 22.1  10 cm proximal to ulnar styloid process 18.6  Just proximal to ulnar styloid process 14.9  Across hand at thumb web space 19  At base of 2nd digit 6  (Blank rows = not tested)  LANDMARK LEFT  06/10/2021  10 cm proximal to olecranon process 23.8  Olecranon process 22.1  10 cm proximal to  ulnar styloid process 18.3  Just proximal to ulnar styloid process 14.4  Across hand at thumb web space 18.4  At base of 2nd digit 5.8  (Blank rows = not tested)   L-DEX LYMPHEDEMA SCREENING:  The patient was assessed using the L-Dex machine today to produce a lymphedema index baseline score. The patient will be reassessed on a regular basis (typically every 3 months) to obtain new L-Dex scores. If the score is > 6.5 points away from his/her baseline score indicating onset of subclinical lymphedema, it will be recommended to wear a compression garment for 4 weeks, 12 hours per day and then be reassessed. If the score continues to be > 6.5 points from baseline at reassessment, we will initiate lymphedema treatment. Assessing in this manner has a 95% rate of preventing clinically significant lymphedema.   L-DEX FLOWSHEETS - 06/10/21 1500       L-DEX LYMPHEDEMA SCREENING   Measurement Type Unilateral    L-DEX MEASUREMENT EXTREMITY Upper Extremity    POSITION  Standing    DOMINANT SIDE Right    At Risk Side Left    BASELINE SCORE (UNILATERAL) -1.3              QUICK DASH SURVEY:  Christina Sanchez - 06/10/21 0001     Open a tight or new jar Unable    Do heavy household chores (wash walls, wash floors) Moderate difficulty    Carry a shopping bag or briefcase Severe difficulty    Wash your back No difficulty    Use a knife to cut food Unable    Recreational activities in which you take some force or impact through your arm, shoulder, or hand (golf, hammering, tennis) No difficulty    During the past week, to what extent has your arm, shoulder or hand problem interfered with your normal social activities with family, friends, neighbors, or groups? Not at all    During the past week, to  what extent has your arm, shoulder or hand problem limited your work or other regular daily activities Not at all    Arm, shoulder, or hand pain. None    Tingling (pins and needles) in your arm, shoulder, or  hand None    Difficulty Sleeping No difficulty    DASH Score 29.55 %              PATIENT EDUCATION:  Education details: Lymphedema risk reduction and post op shoulder/posture HEP Person educated: Patient Education method: Explanation, Demonstration, Handout Education comprehension: Patient verbalized understanding and returned demonstration   HOME EXERCISE PROGRAM: Patient was instructed today in a home exercise program today for post op shoulder range of motion. These included active assist shoulder flexion in sitting, scapular retraction, wall walking with shoulder abduction, and hands behind head external rotation.  She was encouraged to do these twice a day, holding 3 seconds and repeating 5 times when permitted by her physician.   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 It measures 8 mm and is located in the upper outer quadrant. It is ER positive, PR and HER2 negative with a Ki67 of 2%.Her multidisciplinary medical team met prior to her assessments to determine a recommended treatment plan. She is planning to have a left lumpectomy and sentinel node biopsy followed by Oncotype testing depending on final mass size, radiation, and anti-estrogen therapy. She will benefit from a post op PT reassessment to determine needs and from L-Dex screens every 3 months for 2 years to detect subclinical lymphedema.  Pt will benefit from skilled therapeutic intervention to improve on the following deficits: Decreased knowledge of precautions, impaired UE functional use, pain, decreased ROM, postural dysfunction.   PT treatment/interventions: ADL/self-care home management, pt/family education, therapeutic exercise  REHAB POTENTIAL: Excellent  CLINICAL DECISION MAKING: Stable/uncomplicated  EVALUATION COMPLEXITY: Low   GOALS: Goals reviewed with patient? YES  LONG TERM GOALS: (STG=LTG)    Name Target Date Goal status  1 Pt will be able to verbalize understanding of pertinent lymphedema risk reduction practices relevant to her dx specifically related to skin care.  Baseline:  No knowledge 06/10/2021 Achieved at eval  2 Pt will be able to return demo and/or verbalize understanding of the post op HEP related to regaining shoulder ROM. Baseline:  No knowledge 06/10/2021 Achieved at eval  3 Pt will be able to verbalize understanding of the importance of attending the post op After Breast CA Class for further lymphedema risk reduction education and therapeutic exercise.  Baseline:  No knowledge 06/10/2021 Achieved at eval  4 Pt will demo she has regained full shoulder ROM and function post operatively compared to baselines.  Baseline: See objective measurements taken today. 08/05/2021      PLAN: PT FREQUENCY/DURATION: EVAL and 1 follow up appointment.   PLAN FOR NEXT SESSION: will reassess 3-4 weeks post op to determine needs.   Patient will follow up at outpatient cancer rehab 3-4 weeks following surgery.  If the patient requires physical therapy at that time, a specific plan will be dictated and sent to the referring physician for approval. The patient was educated today on appropriate basic range of motion exercises to begin post operatively and the importance of attending the After Breast Cancer class following surgery.  Patient was educated today on lymphedema risk reduction practices as it pertains to recommendations that will benefit the patient immediately following surgery.  She verbalized good understanding.  Physical Therapy Information for After Breast Cancer Surgery/Treatment:  Lymphedema is a swelling condition that you may be at risk for in your arm if you have lymph nodes removed from the armpit area.  After a sentinel node biopsy, the risk is approximately 5-9% and is higher after an axillary node dissection.  There is treatment available for this condition and it is not  life-threatening.  Contact your physician or physical therapist with concerns. You may begin the 4 shoulder/posture exercises (see additional sheet) when permitted by your physician (typically a week after surgery).  If you have drains, you may need to wait until those are removed before beginning range of motion exercises.  A general recommendation is to not lift your arms above shoulder height until drains are removed.  These exercises should be done to your tolerance and gently.  This is not a "no pain/no gain" type of recovery so listen to your body and stretch into the range of motion that you can tolerate, stopping if you have pain.  If you are having immediate reconstruction, ask your plastic surgeon about doing exercises as he or she may want you to wait. We encourage you to attend the free one time ABC (After Breast Cancer) class offered by [facility].  You will learn information related to lymphedema risk, prevention and treatment and additional exercises to regain mobility following surgery.  You can call [phone] for more information.  This is offered the 1st and 3rd Monday of each month.  You only attend the class one time. While undergoing any medical procedure or treatment, try to avoid blood pressure being taken or needle sticks from occurring on the arm on the side of cancer.   This recommendation begins after surgery and continues for the rest of your life.  This may help reduce your risk of getting lymphedema (swelling in your arm). An excellent resource for those seeking information on lymphedema is the National Lymphedema Network's web site. It can be accessed at [place].org If you notice swelling in your hand, arm or breast at any time following surgery (even if it is many years from now), please contact your doctor or physical therapist to discuss this.  Lymphedema can be treated at any time but it is easier for you if it is treated early on.  If you feel  like your shoulder motion is not returning to normal in a reasonable amount of time, please contact your surgeon or physical therapist.  [facility] [phone]. [address], [place] [address]  ABC CLASS After Breast Cancer Class  After Breast Cancer Class is a specially designed exercise class to assist you in a safe recover after having breast cancer surgery.  In this class you will learn how to get back to full function whether your drains were just removed or if you had surgery a month ago.  This one-time class is held the 1st and 3rd Monday of every month from 11:00 a.m. until 12:00 noon virtually.  This class is FREE and space is limited. For more information or to register for the next available class, call [phone].  Class Goals  Understand specific stretches to improve the flexibility of you chest and shoulder. Learn ways to safely strengthen your upper body and improve your posture. Understand the warning signs of infection and why you may be at risk for an arm infection. Learn about Lymphedema and prevention.  ** You do not attend  this class until after surgery.  Drains must be removed to participate  Patient was instructed today in a home exercise program today for post op shoulder range of motion. These included active assist shoulder flexion in sitting, scapular retraction, wall walking with shoulder abduction, and hands behind head external rotation.  She was encouraged to do these twice a day, holding 3 seconds and repeating 5 times when permitted by her physician.    Amila Callies,MARTI COOPER, PT 06/10/2021, 4:23 PM

## 2021-06-10 NOTE — Research (Signed)
[employer] 2021-05 - Specimen Collection Study to Evaluate Biomarkers in Subjects with Cancer   ? ?Patient Christina Sanchez was identified by Dr. Burr Medico as a potential candidate for the above listed study.  This Clinical Research Nurse met with [name], [id number], on 06/10/21 in a manner and location that ensures patient privacy to discuss participation in the above listed research study.  Patient is Accompanied by her son .  A copy of the informed consent document with embedded HIPAA language was provided to the patient.  Patient reads, speaks, and understands [place].   ?Patient was provided with the business card of this Nurse and encouraged to contact the research team with any questions.  Approximately 15 minutes was spent with the patient reviewing the informed consent document.  Patient was provided the option of taking informed consent documents home to review and was encouraged to review at their convenience with their support network, including other care providers. Patient took the consent documents home to review. The pt agreed to read the consent form over the weekend.  The nurse and the pt agreed to speak to each other on Monday, 06/15/21, about the study and her participation.  The pt said that she is interested in the study, and she would probably sign the study consent next week on Tuesday.  The pt was informed that her participation is voluntary. The pt was thanked for her interest in the study.  ?Brion Aliment RN, BSN, CCRP ?Clinical Research Nurse Lead ?06/10/2021 4:37 PM   ?

## 2021-06-10 NOTE — Telephone Encounter (Signed)
Christina Sanchez was seen by a genetic counselor during the breast multidisciplinary clinic on 06/10/2021. She does not have any other personal history of cancer nor does she have any family history of cancer. She does not meet NCCN criteria for genetic testing at this time. She was still offered genetic counseling and testing but declined. We encourage her to contact us if there are any changes to her personal or family history of cancer. If she meets NCCN criteria based on the updated personal/family history, she would be recommended to have genetic counseling and testing.  ? ?Christina Passy, MS, [place] ?Genetic Counselor ?[email or web address] ?(P) [phone] ? ? ? ?

## 2021-06-10 NOTE — Progress Notes (Signed)
[place]   [phone] Fax:([phone]) [place] Note   Patient Care Team: Leeroy Cha, MD as PCP - General (Internal Medicine) Erroll Luna, MD as Consulting Physician (General Surgery) Truitt Merle, MD as Consulting Physician (Hematology) Kyung Rudd, MD as Consulting Physician (Radiation Oncology) Rockwell Germany, RN as Oncology Nurse Navigator Mauro Kaufmann, RN as Oncology Nurse Navigator  Date of Service:  06/10/2021   CHIEF COMPLAINTS/PURPOSE OF CONSULTATION:  Left Breast Cancer, ER+  REFERRING PHYSICIAN:  [facility]   ASSESSMENT & PLAN:  Christina Sanchez is a 71 y.o. postmenopausal female with   1. Malignant neoplasm of upper-outer quadrant of left breast, invasive lobular carcinoma, stage IA, c(T1b, N0), ER+/PR+/HER2-, Grade 1, (+) LCIS -found on screening mammogram. Left diagnostic MM and [place] on 05/23/21 showed 8 mm mass at 2 o'clock. Biopsy 06/04/21 showed invasive and in situ lobular carcinoma. --We discussed her imaging findings and the biopsy results in great details. -Given the early stage disease, she likely need a lumpectomy. She is agreeable with that. She was seen by Dr. Brantley Stage today and likely will proceed with surgery soon.  Given her age and early stage disease, sentinel lymph node biopsy is felt to be not necessary. -depending on the size of her final tumor, I recommend a Oncotype Dx test on the surgical sample and we'll make a decision about adjuvant chemotherapy based on the Oncotype result. Written material of this test was given to her. She is 71 yo, does have some medical comorbidities, particularly recent weight loss and depression, may not be a good candidate for intensive chemotherapy.  However she is interested in knowing her risk of recurrence and agrees to proceed with Oncotype if needed. -Giving the strong ER and PR expression in her postmenopausal status, I recommend adjuvant endocrine  therapy with aromatase inhibitor for a total of 5-10 years to reduce the risk of cancer recurrence. Given her arthritis, she may tolerate aromatase inhibitor well, I would recommend tamoxifen. Potential benefits and side effects were discussed with patient and she is interested. -She was also seen by radiation oncologist Dr. Lisbeth Renshaw today. She will likely benefit from breast radiation if she undergo lumpectomy to decrease the risk of breast cancer. -We also discussed the breast cancer surveillance after her surgery. She will continue annual screening mammogram, self exam, and a routine office visit with lab and exam with [place]. -I encouraged her to have healthy diet and exercise regularly.   2. Health Maintenance -currently smokes 8-9 cigarettes a day. I discussed cessation and explained her appetite will likely increase if she stops. She is interested if insurance will cover, but she declines to start today. -currently has 2-3 glasses of wine a day. I discussed this affects her liver and has risk for breast cancer. I advised her to at least cut back.  3.  Depression and weight loss -Recently started on mirtazapine, will continue   4. Smoking and alcohol drinking -She has longstanding history of smoking and moderate alcohol drinking, we discussed the negative impact of smoking and alcohol on her health, and I strongly encouraged her to quit or at least cut back, she will think about it   PLAN:  -will proceed with lumpectomy soon -will order Oncotype if her tumor >1.0cm on final surgical path  -I will see her back after adjuvant radiation, or sooner if needed. -She is interested in our exact science clinical trial, will refer her to research team.  Oncology History Overview Note   Cancer Staging  Malignant neoplasm of upper-outer quadrant of left breast in female, estrogen receptor positive ([place]) Staging form: Breast, AJCC 8th Edition - Clinical stage from 06/04/2021: Stage IA (cT1b, cN0, cM0, G1,  ER+, PR+, HER2-) - Signed by Truitt Merle, MD on 06/09/2021    Malignant neoplasm of upper-outer quadrant of left breast in female, estrogen receptor positive ([place])  05/23/2021 Mammogram   CLINICAL DATA:  Recall from screening mammography, possible focal asymmetry involving the outer LEFT breast at middle depth.   EXAM: DIGITAL DIAGNOSTIC UNILATERAL LEFT MAMMOGRAM WITH TOMOSYNTHESIS AND CAD; ULTRASOUND LEFT BREAST LIMITED  IMPRESSION: 1. Highly suspicious 8 mm mass in the UPPER OUTER QUADRANT of the LEFT breast at the 10 o'clock position 2 cm from nipple. 2. No pathologic LEFT axillary lymphadenopathy.   06/04/2021 Cancer Staging   Staging form: Breast, AJCC 8th Edition - Clinical stage from 06/04/2021: Stage IA (cT1b, cN0, cM0, G1, ER+, PR+, HER2-) - Signed by Truitt Merle, MD on 06/09/2021 Stage prefix: Initial diagnosis Histologic grading system: 3 grade system    06/04/2021 Initial Biopsy   Diagnosis Breast, left, needle core biopsy, 2 o'clock INVASIVE WELL DIFFERENTIATED MAMMARY CARCINOMA, GRADE 1 (3+1+1) FOCAL DUCTAL CARCINOMA IN SITU, HIGH NUCLEAR GRADE, SOLID TYPE WITHOUT NECROSIS LOBULAR CARCINOMA IN SITU  An immunohistochemical stain for E-cadherin is performed with adequate control. The E-cadherin is negative within the invasive tumor and within all of the in situ carcinoma. This immunohistochemical supports a diagnosis of invasive lobular carcinoma (3+1+1) with associated lobular carcinoma in situ (LCIS) with a focus of pleomorphic LCIS. No high-grade ductal carcinoma in situ is evident with the results of this immunohistochemical stain.  PROGNOSTIC INDICATORS Results: The tumor cells are NEGATIVE for Her2 (1+). Estrogen Receptor: 95%, POSITIVE, STRONG STAINING INTENSITY Progesterone Receptor: 2%, POSITIVE, STRONG STAINING INTENSITY Proliferation Marker Ki67: 2%   06/08/2021 Initial Diagnosis   Malignant neoplasm of upper-outer quadrant of left breast in female, estrogen  receptor positive ([place])      HISTORY OF PRESENTING ILLNESS:  Christina Sanchez 71 y.o. female is a here because of breast cancer. The patient was referred by [facility]. The patient presents to the clinic today accompanied by her son.   She had routine screening mammography on 05/12/21 showing a possible abnormality in the left breast. She underwent left diagnostic mammography and left breast ultrasonography on 05/23/21 showing: 8 mm mass in left breast at 2 o'clock.  Biopsy on 06/04/21 showed: invasive well differentiated mammary carcinoma, grade 1; focal DCIS; LCIS. Prognostic indicators significant for: estrogen receptor, 95% positive and progesterone receptor, 2% positive. Proliferation marker Ki67 at 2%. HER2 negative.   Today the patient notes they felt/feeling prior/after... -more breast pain on the right than the left  She has a PMHx of.... -arthritis, mainly in her knees and hips -depression (as well as poor appetite), now on mirtazapine -s/p hysterectomy  Socially... -she recently retired from driving for [employer]. -she is divorced and lives with her son. He has a 64 year old son. -her daughter died from a seizure in 2009-06-12. -no family history of cancer to her knowledge -she currently smokes 8-9 cigarettes a day, started at age 71. -she drinks 2-3 glasses of wine a day  GYN HISTORY  Menarchal: xx LMP: xx Contraceptive: HRT:  GP: 2   REVIEW OF SYSTEMS:    Constitutional: Denies fevers, chills or abnormal night sweats Eyes: Denies blurriness of vision, double vision or watery eyes Ears, nose, mouth,  throat, and face: Denies mucositis or sore throat Respiratory: Denies cough, dyspnea or wheezes Cardiovascular: Denies palpitation, chest discomfort or lower extremity swelling Gastrointestinal:  Denies nausea, heartburn or change in bowel habits Skin: Denies abnormal skin rashes Lymphatics: Denies new lymphadenopathy or easy bruising Neurological:Denies  numbness, tingling or new weaknesses Behavioral/Psych: Mood is stable, no new changes  All other systems were reviewed with the patient and are negative.   MEDICAL HISTORY:  Past Medical History:  Diagnosis Date   Arthritis    Bilateral occipital neuralgia    Hypertension     SURGICAL HISTORY: Past Surgical History:  Procedure Laterality Date   fistula track removal     rectal   PARTIAL HYSTERECTOMY      SOCIAL HISTORY: Social History   Socioeconomic History   Marital status: Single    Spouse name: Not on file   Number of children: 1   Years of education: Not on file   Highest education level: High school graduate  Occupational History   Not on file  Tobacco Use   Smoking status: Every Day    Packs/day: 0.50    Years: 50.00    Pack years: 25.00    Types: Cigarettes   Smokeless tobacco: Never   Tobacco comments:    8-9 cigarattes/day  Vaping Use   Vaping Use: Never used  Substance and Sexual Activity   Alcohol use: Yes    Alcohol/week: 15.0 standard drinks    Types: 15 Glasses of wine per week    Comment: occ wine   Drug use: No   Sexual activity: Not on file  Other Topics Concern   Not on file  Social History Narrative   Lives at home with her son   Right handed   Caffeine: 1 cup of tea daily   Social Determinants of Health   Financial Resource Strain: Not on file  Food Insecurity: Not on file  Transportation Needs: Not on file  Physical Activity: Not on file  Stress: Not on file  Social Connections: Not on file  Intimate Partner Violence: Not on file    FAMILY HISTORY: Family History  Problem Relation Age of Onset   Diabetes Brother    Migraines Neg Hx    Headache Neg Hx    Breast cancer Neg Hx     ALLERGIES:  is allergic to aspirin, latex, lexapro [escitalopram], and penicillins.  MEDICATIONS:  Current Outpatient Medications  Medication Sig Dispense Refill   albuterol (VENTOLIN HFA) 108 (90 Base) MCG/ACT inhaler Inhale 2 puffs into  the lungs as needed.     amLODipine-benazepril (LOTREL) 5-10 MG capsule Take 1 capsule by mouth daily.     atorvastatin (LIPITOR) 10 MG tablet Take 10 mg by mouth every morning.     Cyanocobalamin (B-12) 1000 MCG CAPS Take 1 capsule by mouth daily.     mirtazapine (REMERON) 15 MG tablet Take 15 mg by mouth at bedtime.     zolpidem (AMBIEN) 10 MG tablet Take 10 mg by mouth at bedtime.     No current facility-administered medications for this visit.    PHYSICAL EXAMINATION: ECOG PERFORMANCE STATUS: 1 - Symptomatic but completely ambulatory  Vitals:   06/10/21 1301  BP: 138/77  Pulse: 85  Resp: 18  Temp: 97.9 F (36.6 C)  SpO2: 100%   Filed Weights   06/10/21 1301  Weight: 126 lb 14.4 oz (57.6 kg)    GENERAL:alert, no distress and comfortable SKIN: skin color, texture, turgor are normal, no rashes  or significant lesions EYES: normal, Conjunctiva are pink and non-injected, sclera clear  NECK: supple, thyroid normal size, non-tender, without nodularity LYMPH:  no palpable lymphadenopathy in the cervical, axillary  ABDOMEN:abdomen soft, non-tender and normal bowel sounds Musculoskeletal:no cyanosis of digits and no clubbing  NEURO: alert & oriented x 3 with fluent speech, no focal motor/sensory deficits BREAST: No palpable mass, nodules or adenopathy bilaterally. Breast exam benign.  LABORATORY DATA:  I have reviewed the data as listed CBC Latest Ref Rng & Units 06/10/2021 03/21/2016  WBC 4.0 - 10.5 K/uL 6.7 6.9  Hemoglobin 12.0 - 15.0 g/dL 13.3 13.7  Hematocrit 36.0 - 46.0 % 39.8 39.8  Platelets 150 - 400 K/uL 381 251    CMP Latest Ref Rng & Units 06/10/2021 03/21/2016  Glucose 70 - 99 mg/dL 84 73  BUN 8 - 23 mg/dL 8 7  Creatinine 0.44 - 1.00 mg/dL 0.71 0.75  Sodium 135 - 145 mmol/L 139 136  Potassium 3.5 - 5.1 mmol/L 4.3 4.2  Chloride 98 - 111 mmol/L 104 103  CO2 22 - 32 mmol/L 27 25  Calcium 8.9 - 10.3 mg/dL 10.0 9.7  Total Protein 6.5 - 8.1 g/dL 7.9 -  Total  Bilirubin 0.3 - 1.2 mg/dL 0.5 -  Alkaline Phos 38 - 126 U/L 120 -  AST 15 - 41 U/L 14(L) -  ALT 0 - 44 U/L 9 -     RADIOGRAPHIC STUDIES: I have personally reviewed the radiological images as listed and agreed with the findings in the report. DG Chest 2 View  Result Date: 05/15/2021 CLINICAL DATA:  Chest wall pain. EXAM: CHEST - 2 VIEW COMPARISON:  December 31, 2017 FINDINGS: Platelike opacity in left retrocardiac region noted to represent atelectasis or scar on a CT scan from earlier today. No nodules or masses. Hyperinflation of the lungs. The cardiomediastinal silhouette is stable. No other abnormalities. IMPRESSION: Hyperinflation of the lungs suggesting COPD given history of smoking. Atelectasis or scar in the left retrocardiac region. Electronically Signed   By: Dorise Bullion III M.D.   On: 05/15/2021 19:06   DG Finger Little Left  Result Date: 05/14/2021 CLINICAL DATA:  Left little finger painful swelling close to metacarpophalangeal joint. Knot near metacarpophalangeal joint of little finger for 2-3 years. EXAM: LEFT LITTLE FINGER 2+V COMPARISON:  None. FINDINGS: Mildly decreased bone mineralization. Moderate PIP and DIP joint space narrowing of 5th finger. Mild metacarpophalangeal joint space narrowing of 5th finger. No acute fracture is seen. No dislocation. 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 No acute fracture is seen. Electronically Signed   By: Yvonne Kendall M.D.   On: 05/14/2021 19:10   US BREAST LTD UNI LEFT INC AXILLA  Addendum Date: 06/04/2021   ADDENDUM REPORT: 06/04/2021 09:44 ADDENDUM: Correction: The mass in the LEFT breast is at the 2 o'clock position 2 cm from the nipple. Electronically Signed   By: Evangeline Dakin M.D.   On: 06/04/2021 09:44   Result Date: 06/04/2021 CLINICAL DATA:  Recall from screening mammography, possible focal asymmetry involving the outer LEFT breast at middle  depth. EXAM: DIGITAL DIAGNOSTIC UNILATERAL LEFT MAMMOGRAM WITH TOMOSYNTHESIS AND CAD; ULTRASOUND LEFT BREAST LIMITED TECHNIQUE: Left digital diagnostic mammography and breast tomosynthesis was performed. The images were evaluated with computer-aided detection.; Targeted ultrasound examination of the left breast was performed. COMPARISON:  Previous exam(s). ACR Breast Density Category c: The breast tissue is heterogeneously dense, which may obscure small masses. FINDINGS: Spot-compression CC and MLO  views of the area of concern were obtained. The focal asymmetry in the outer breast at middle depth persists on the spot compression views. There is no underlying architectural distortion or suspicious calcifications. Targeted ultrasound is performed, demonstrating an irregular hypoechoic mass with angular margins at the 10 o'clock position 2 cm from nipple measuring approximately 8 x 5 x 8 mm, demonstrating posterior acoustic shadowing and demonstrating peripheral power Doppler flow, located within dense fibroglandular tissue, corresponding to the screening mammographic finding. Sonographic evaluation of the LEFT axilla demonstrates no pathologic lymphadenopathy. On correlative physical examination, there is vague palpable firmness in the upper-outer periareolar location at the site of the sonographic mass. IMPRESSION: 1. Highly suspicious 8 mm mass in the UPPER OUTER QUADRANT of the LEFT breast at the 10 o'clock position 2 cm from nipple. 2. No pathologic LEFT axillary lymphadenopathy. RECOMMENDATION: Ultrasound-guided core needle biopsy of the suspicious LEFT breast mass. I have discussed the findings and recommendations with the patient. The ultrasound core needle biopsy procedure was discussed with the patient and her questions were answered. She wishes to proceed with the biopsy which has been scheduled by the Breast Imaging staff. BI-RADS CATEGORY  5: Highly suggestive of malignancy. Electronically Signed: By:  Evangeline Dakin M.D. On: 05/23/2021 08:32  MM DIAG BREAST TOMO UNI LEFT  Addendum Date: 06/04/2021   ADDENDUM REPORT: 06/04/2021 09:44 ADDENDUM: Correction: The mass in the LEFT breast is at the 2 o'clock position 2 cm from the nipple. Electronically Signed   By: Evangeline Dakin M.D.   On: 06/04/2021 09:44   Result Date: 06/04/2021 CLINICAL DATA:  Recall from screening mammography, possible focal asymmetry involving the outer LEFT breast at middle depth. EXAM: DIGITAL DIAGNOSTIC UNILATERAL LEFT MAMMOGRAM WITH TOMOSYNTHESIS AND CAD; ULTRASOUND LEFT BREAST LIMITED TECHNIQUE: Left digital diagnostic mammography and breast tomosynthesis was performed. The images were evaluated with computer-aided detection.; Targeted ultrasound examination of the left breast was performed. COMPARISON:  Previous exam(s). ACR Breast Density Category c: The breast tissue is heterogeneously dense, which may obscure small masses. FINDINGS: Spot-compression CC and MLO views of the area of concern were obtained. The focal asymmetry in the outer breast at middle depth persists on the spot compression views. There is no underlying architectural distortion or suspicious calcifications. Targeted ultrasound is performed, demonstrating an irregular hypoechoic mass with angular margins at the 10 o'clock position 2 cm from nipple measuring approximately 8 x 5 x 8 mm, demonstrating posterior acoustic shadowing and demonstrating peripheral power Doppler flow, located within dense fibroglandular tissue, corresponding to the screening mammographic finding. Sonographic evaluation of the LEFT axilla demonstrates no pathologic lymphadenopathy. On correlative physical examination, there is vague palpable firmness in the upper-outer periareolar location at the site of the sonographic mass. IMPRESSION: 1. Highly suspicious 8 mm mass in the UPPER OUTER QUADRANT of the LEFT breast at the 10 o'clock position 2 cm from nipple. 2. No pathologic LEFT axillary  lymphadenopathy. RECOMMENDATION: Ultrasound-guided core needle biopsy of the suspicious LEFT breast mass. I have discussed the findings and recommendations with the patient. The ultrasound core needle biopsy procedure was discussed with the patient and her questions were answered. She wishes to proceed with the biopsy which has been scheduled by the Breast Imaging staff. BI-RADS CATEGORY  5: Highly suggestive of malignancy. Electronically Signed: By: Evangeline Dakin M.D. On: 05/23/2021 08:32  MM 3D SCREEN BREAST BILATERAL  Result Date: 05/13/2021 CLINICAL DATA:  Screening. EXAM: DIGITAL SCREENING BILATERAL MAMMOGRAM WITH TOMOSYNTHESIS AND CAD TECHNIQUE: Bilateral screening digital craniocaudal  and mediolateral oblique mammograms were obtained. Bilateral screening digital breast tomosynthesis was performed. The images were evaluated with computer-aided detection. COMPARISON:  Previous exam(s). ACR Breast Density Category c: The breast tissue is heterogeneously dense, which may obscure small masses. FINDINGS: In the left breast, a possible asymmetry warrants further evaluation. This possible asymmetry is seen within the upper-outer quadrant, MLO slice 33. In the right breast, no findings suspicious for malignancy. IMPRESSION: Further evaluation is suggested for possible asymmetry in the left breast. RECOMMENDATION: Diagnostic mammogram and possibly ultrasound of the left breast. (Code:[id number]) The patient will be contacted regarding the findings, and additional imaging will be scheduled. BI-RADS CATEGORY  0: Incomplete. Need additional imaging evaluation and/or prior mammograms for comparison. Electronically Signed   By: Franki Cabot M.D.   On: 05/13/2021 10:49   CT CHEST LUNG CA SCREEN LOW DOSE W/O CM  Result Date: 05/15/2021 CLINICAL DATA:  71 year old asymptomatic female current smoker with 25 pack-year smoking history. EXAM: CT CHEST WITHOUT CONTRAST LOW-DOSE FOR LUNG CANCER SCREENING TECHNIQUE:  Multidetector CT imaging of the chest was performed following the standard protocol without IV contrast. RADIATION DOSE REDUCTION: This exam was performed according to the departmental dose-optimization program which includes automated exposure control, adjustment of the mA and/or kV according to patient size and/or use of iterative reconstruction technique. COMPARISON:  None. FINDINGS: Cardiovascular: Normal heart size. No significant pericardial effusion/thickening. Atherosclerotic nonaneurysmal thoracic aorta. Normal caliber pulmonary arteries. Mediastinum/Nodes: No discrete thyroid nodules. Unremarkable esophagus. No pathologically enlarged axillary, mediastinal or hilar lymph nodes, noting limited sensitivity for the detection of hilar adenopathy on this noncontrast study. Lungs/Pleura: No pneumothorax. No pleural effusion. Mild centrilobular emphysema. Mild bandlike scarring at the dependent left greater than right lung bases. No acute consolidative airspace disease or lung masses. No significant pulmonary nodules. Subcentimeter calcified pulmonary granuloma in left lower lobe. Upper abdomen: No acute abnormality. Musculoskeletal: No aggressive appearing focal osseous lesions. Mild thoracic spondylosis. IMPRESSION: Lung-RADS 1, negative. Continue annual screening with low-dose chest CT without contrast in 12 months. Aortic Atherosclerosis (ICD10-I70.0) and Emphysema (ICD10-J43.9). Electronically Signed   By: Ilona Sorrel M.D.   On: 05/15/2021 15:43   MM CLIP PLACEMENT LEFT  Result Date: 06/04/2021 CLINICAL DATA:  Patient status post ultrasound-guided biopsy left breast mass. EXAM: 3D DIAGNOSTIC LEFT MAMMOGRAM POST ULTRASOUND BIOPSY COMPARISON:  Previous exam(s). FINDINGS: 3D Mammographic images were obtained following ultrasound guided biopsy of left breast mass. The biopsy marking clip is in expected position at the site of biopsy. IMPRESSION: Appropriate positioning of the ribbon shaped biopsy marking  clip at the site of biopsy in the upper-outer left breast. Final Assessment: Post Procedure Mammograms for Marker Placement Electronically Signed   By: Lovey Newcomer M.D.   On: 06/04/2021 09:46  [place] LT BREAST BX W LOC DEV 1ST LESION IMG BX SPEC US GUIDE  Result Date: 06/04/2021 CLINICAL DATA:  Patient with indeterminate left breast mass 2 o'clock position. EXAM: ULTRASOUND GUIDED LEFT BREAST CORE NEEDLE BIOPSY COMPARISON:  Previous exam(s). PROCEDURE: I met with the patient and we discussed the procedure of ultrasound-guided biopsy, including benefits and alternatives. We discussed the high likelihood of a successful procedure. We discussed the risks of the procedure, including infection, bleeding, tissue injury, clip migration, and inadequate sampling. Informed written consent was given. The usual time-out protocol was performed immediately prior to the procedure. Lesion quadrant: Upper outer quadrant Using sterile technique and 1% Lidocaine as local anesthetic, under direct ultrasound visualization, a 14 gauge spring-loaded device was used to perform  biopsy of left breast mass 2 o'clock position using a lateral approach. At the conclusion of the procedure ribbon tissue marker clip was deployed into the biopsy cavity. Follow up 2 view mammogram was performed and dictated separately. IMPRESSION: Ultrasound guided biopsy of left breast mass 2 o'clock position. No apparent complications. Electronically Signed   By: Lovey Newcomer M.D.   On: 06/04/2021 09:44  DG MOBILE BONE DENSITY  Result Date: 05/13/2021 CLINICAL DATA:  Postmenopausal. EXAM: DUAL X-RAY ABSORPTIOMETRY (DXA) FOR BONE MINERAL DENSITY TECHNIQUE: Bone mineral density measurements are performed of the spine, hip, and forearm, as appropriate, per International Society of Clinical Densitometry recommendations. The pertinent regions of interest are reported below. Non-contributory values are not reported. Images are obtained for bone mineral density  measurement and are not obtained for diagnostic purposes. FINDINGS: AP LUMBAR SPINE L1 through L4 Bone Mineral Density (BMD):  0.915 g/cm2 Young Adult T-Score:  -2.1 Z-Score:  0.2 LEFT FEMUR NECK Bone Mineral Density (BMD):  0.766 g/cm2 Young Adult T-Score: -1.3 Z-Score:  0.1 Unit: This study was performed at [facility] on the [place] (S/N [phone]), software version 13.4.2. Scan quality: The scan quality is good. Exclusions: None. ASSESSMENT: Patient's diagnostic category is LOW BONE MASS/OSTEOPENIA by [facility] Criteria. FRACTURE RISK: INCREASED FRAX: Based on the Lowgap model, the 10 year probability of a major osteoporotic fracture is 6.4%. The 10 year probability of a hip fracture is 1.3%. COMPARISON: None. RECOMMENDATIONS 1. All patients should optimize calcium and vitamin D intake. 2. Consider FDA-approved medical therapies in postmenopausal women and men aged 70 years and older, based on the following: - A hip or vertebral (clinical or morphometric) fracture - T-score less than or equal to -2.5 at the femoral neck or spine after appropriate evaluation to exclude secondary causes - Low bone mass (T-score between -1.0 and -2.5 at the femoral neck or spine) and a 10-year probability of a hip fracture greater than or equal to 3% or a 10-year probability of a major osteoporosis-related fracture greater than or equal to 20% based on the [place]-adapted WHO algorithm - Clinician judgment and/or patient preferences may indicate treatment for people with 10-year fracture probabilities above or below these levels 3. Patients with diagnosis of osteoporosis or at high risk for fracture should have regular bone mineral density tests. For patients eligible for Medicare, routine testing is allowed once every 2 years. The testing frequency can be increased to one year for patients who have rapidly progressing disease, those who are receiving or discontinuing medical therapy to restore bone  mass, or have additional risk factors. Electronically Signed   By: Lajean Manes M.D.   On: 05/13/2021 09:36     No orders of the defined types were placed in this encounter.   All questions were answered. The patient knows to call the clinic with any problems, questions or concerns. The total time spent in the appointment was 60 minutes.     Truitt Merle, MD 06/10/2021 9:36 PM  I, Wilburn Mylar, am acting as scribe for Truitt Merle, MD.   I have reviewed the above documentation for accuracy and completeness, and I agree with the above.

## 2021-06-12 ENCOUNTER — Ambulatory Visit: Payer: Medicare Other

## 2021-06-12 ENCOUNTER — Other Ambulatory Visit: Payer: Self-pay | Admitting: Surgery

## 2021-06-12 DIAGNOSIS — C50912 Malignant neoplasm of unspecified site of left female breast: Secondary | ICD-10-CM

## 2021-06-12 DIAGNOSIS — Z170 Estrogen receptor positive status [ER+]: Secondary | ICD-10-CM

## 2021-06-15 ENCOUNTER — Telehealth: Payer: Self-pay | Admitting: *Deleted

## 2021-06-15 NOTE — Telephone Encounter (Signed)
[employer] 2021-05 - Specimen Collection Study to Evaluate Biomarkers in Subjects with Cancer   ? ?The research nurse called and spoke to the pt this afternoon.  The pt said that she has had a very busy day.  The pt said that she has not read over the consent form yet.  The pt found the consent form, and she asked that the nurse call her back on Wednesday, 06/17/21 to answer her questions and to discuss her study participation.  The pt was encouraged to share the consent form with her family members.  The pt was thanked for her consideration of this trial. ?Brion Aliment RN, BSN, CCRP ?Clinical Research Nurse Lead ?06/15/2021 4:46 PM   ?

## 2021-06-16 ENCOUNTER — Ambulatory Visit: Payer: Self-pay

## 2021-06-17 ENCOUNTER — Telehealth: Payer: Self-pay | Admitting: *Deleted

## 2021-06-17 NOTE — Telephone Encounter (Signed)
[employer] 2021-05 - Specimen Collection Study to Evaluate Biomarkers in Subjects with Cancer   ? ?The nurse called the pt back to discuss the study as requested by the pt.  The pt confirmed that she did read over the consent form.  The pt said that she "changed her mind" about participation.  She could not provide a specific reason for declining the study. The pt mentioned that she might not qualify for the study.  The pt was reassured that she does qualify for the study.  The pt was told that if she changes her mind about the study to call the research nurse. The pt stated she had the nurse's business card.  The pt was told that she can participate anytime before her surgery date.  The pt verbalized understanding. The pt was thanked for her time and consideration of the study.  ?Brion Aliment RN, BSN, CCRP ?Clinical Research Nurse Lead ?06/17/2021 3:26 PM   ?

## 2021-06-18 ENCOUNTER — Encounter: Payer: Self-pay | Admitting: *Deleted

## 2021-06-18 ENCOUNTER — Telehealth: Payer: Self-pay | Admitting: *Deleted

## 2021-06-18 ENCOUNTER — Encounter: Payer: Self-pay | Admitting: General Practice

## 2021-06-18 ENCOUNTER — Encounter (HOSPITAL_BASED_OUTPATIENT_CLINIC_OR_DEPARTMENT_OTHER): Payer: Self-pay | Admitting: Surgery

## 2021-06-18 NOTE — Telephone Encounter (Signed)
Spoke with patient to follow up from [facility] 3/1 and assess navigation needs. Patient denies any questions or concerns at this time. Encouraged her to call should anything arise. Patient verbalized understanding.  ?

## 2021-06-18 NOTE — Progress Notes (Signed)
[facility] Psychosocial Distress Screening ?Spiritual Care ? ?Met with Christina Sanchez by phone following Breast [facility] to introduce [place] team/resources, reviewing distress screen per protocol.  The patient scored a 9 on the Psychosocial Distress Thermometer which indicates severe distress. Also assessed for distress and other psychosocial needs.  ? ?ONCBCN DISTRESS SCREENING 06/18/2021  ?Screening Type Initial Screening  ?Distress experienced in past week (1-10) 9  ?Family Problem type Other (comment)  ?Physical Problem type Pain;Loss of appetitie;Constipation/diarrhea  ?Referral to support programs Yes  ? ?Christina Sanchez (pronounced Mill-ARD) reports feeling increasing nervous and depressed as her surgery date approaches. She is open to an [employer] referral, which I have placed, so that she can speak with a peer mentor about surgery to help her process and learn more about what she might expect. ? ?Provided empathic listening, normalization of feelings, emotional support, and introduction to [employer] support programming.  ? ?Follow up needed: No. Christina Sanchez has my direct number and prefers to reach out if needed/desired. ? ? ?Chaplain Lorrin Jackson, MDiv, [facility] ?Pager [phone] ?Voicemail [phone] ? ? ? ? ?  ?

## 2021-06-23 ENCOUNTER — Encounter (HOSPITAL_BASED_OUTPATIENT_CLINIC_OR_DEPARTMENT_OTHER)
Admission: RE | Admit: 2021-06-23 | Discharge: 2021-06-23 | Disposition: A | Discharge status: Home / Self Care | Payer: Medicare Other | Source: Ambulatory Visit | Attending: Surgery | Admitting: Surgery

## 2021-06-23 DIAGNOSIS — Z01812 Encounter for preprocedural laboratory examination: Secondary | ICD-10-CM | POA: Insufficient documentation

## 2021-06-23 MED ORDER — CHLORHEXIDINE GLUCONATE CLOTH 2 % EX PADS: 6.0000 | MEDICATED_PAD | Freq: Once | CUTANEOUS | Status: DC

## 2021-06-29 ENCOUNTER — Ambulatory Visit
Admission: RE | Admit: 2021-06-29 | Discharge: 2021-06-29 | Disposition: A | Discharge status: Home / Self Care | Payer: Medicare Other | Source: Ambulatory Visit | Attending: Surgery | Admitting: Surgery

## 2021-06-29 DIAGNOSIS — C50912 Malignant neoplasm of unspecified site of left female breast: Secondary | ICD-10-CM

## 2021-06-30 ENCOUNTER — Other Ambulatory Visit: Payer: Self-pay

## 2021-06-30 ENCOUNTER — Ambulatory Visit (HOSPITAL_BASED_OUTPATIENT_CLINIC_OR_DEPARTMENT_OTHER): Payer: Medicare Other | Admitting: Anesthesiology

## 2021-06-30 ENCOUNTER — Ambulatory Visit (HOSPITAL_BASED_OUTPATIENT_CLINIC_OR_DEPARTMENT_OTHER)
Admission: RE | Admit: 2021-06-30 | Discharge: 2021-06-30 | Disposition: A | Discharge status: Home / Self Care | Payer: Medicare Other | Attending: Surgery | Admitting: Surgery

## 2021-06-30 ENCOUNTER — Encounter (HOSPITAL_BASED_OUTPATIENT_CLINIC_OR_DEPARTMENT_OTHER)
Admission: RE | Disposition: A | Discharge status: Home / Self Care | Payer: Self-pay | Source: Home / Self Care | Attending: Surgery

## 2021-06-30 ENCOUNTER — Ambulatory Visit
Admission: RE | Admit: 2021-06-30 | Discharge: 2021-06-30 | Disposition: A | Discharge status: Home / Self Care | Payer: Medicare Other | Source: Ambulatory Visit | Attending: Surgery | Admitting: Surgery

## 2021-06-30 ENCOUNTER — Encounter (HOSPITAL_BASED_OUTPATIENT_CLINIC_OR_DEPARTMENT_OTHER): Payer: Self-pay | Admitting: Surgery

## 2021-06-30 DIAGNOSIS — F17210 Nicotine dependence, cigarettes, uncomplicated: Secondary | ICD-10-CM | POA: Insufficient documentation

## 2021-06-30 DIAGNOSIS — C50412 Malignant neoplasm of upper-outer quadrant of left female breast: Secondary | ICD-10-CM

## 2021-06-30 DIAGNOSIS — Z170 Estrogen receptor positive status [ER+]: Secondary | ICD-10-CM | POA: Insufficient documentation

## 2021-06-30 DIAGNOSIS — C50912 Malignant neoplasm of unspecified site of left female breast: Secondary | ICD-10-CM

## 2021-06-30 DIAGNOSIS — I10 Essential (primary) hypertension: Secondary | ICD-10-CM | POA: Diagnosis not present

## 2021-06-30 HISTORY — PX: BREAST LUMPECTOMY WITH RADIOACTIVE SEED AND SENTINEL LYMPH NODE BIOPSY: SHX6550

## 2021-06-30 SURGERY — BREAST LUMPECTOMY WITH RADIOACTIVE SEED AND SENTINEL LYMPH NODE BIOPSY
Anesthesia: General | Site: Breast | Laterality: Left

## 2021-06-30 MED ORDER — PROPOFOL 10 MG/ML IV BOLUS
INTRAVENOUS | Status: DC | PRN
  Administered 2021-06-30: 100 mg via INTRAVENOUS

## 2021-06-30 MED ORDER — ACETAMINOPHEN 500 MG PO TABS
ORAL_TABLET | ORAL | Status: AC
  Filled 2021-06-30: qty 2

## 2021-06-30 MED ORDER — DEXAMETHASONE SODIUM PHOSPHATE 4 MG/ML IJ SOLN
INTRAMUSCULAR | Status: DC | PRN
  Administered 2021-06-30: 5 mg via INTRAVENOUS

## 2021-06-30 MED ORDER — GENTAMICIN SULFATE 40 MG/ML IJ SOLN
INTRAMUSCULAR | Status: DC | PRN
  Administered 2021-06-30: 250 mL

## 2021-06-30 MED ORDER — CLINDAMYCIN PHOSPHATE 900 MG/50ML IV SOLN
INTRAVENOUS | Status: AC
Start: 2021-06-30 — End: ?
  Filled 2021-06-30: qty 50

## 2021-06-30 MED ORDER — GABAPENTIN 300 MG PO CAPS
ORAL_CAPSULE | ORAL | Status: AC
Start: 2021-06-30 — End: ?
  Filled 2021-06-30: qty 1

## 2021-06-30 MED ORDER — FENTANYL CITRATE (PF) 100 MCG/2ML IJ SOLN
50.0000 ug | Freq: Once | INTRAMUSCULAR | Status: AC
  Administered 2021-06-30: 50 ug via INTRAVENOUS

## 2021-06-30 MED ORDER — BUPIVACAINE LIPOSOME 1.3 % IJ SUSP
INTRAMUSCULAR | Status: DC | PRN
  Administered 2021-06-30: 10 mL via PERINEURAL

## 2021-06-30 MED ORDER — FENTANYL CITRATE (PF) 100 MCG/2ML IJ SOLN
INTRAMUSCULAR | Status: AC
  Filled 2021-06-30: qty 2

## 2021-06-30 MED ORDER — GABAPENTIN 300 MG PO CAPS
300.0000 mg | ORAL_CAPSULE | ORAL | Status: AC
  Administered 2021-06-30: 300 mg via ORAL

## 2021-06-30 MED ORDER — LACTATED RINGERS IV SOLN: INTRAVENOUS | Status: DC

## 2021-06-30 MED ORDER — MAGTRACE LYMPHATIC TRACER
INTRAMUSCULAR | Status: DC | PRN
  Administered 2021-06-30: 2 mL via INTRAMUSCULAR

## 2021-06-30 MED ORDER — FENTANYL CITRATE (PF) 100 MCG/2ML IJ SOLN
25.0000 ug | INTRAMUSCULAR | Status: DC | PRN
  Administered 2021-06-30 (×2): 25 ug via INTRAVENOUS

## 2021-06-30 MED ORDER — ONDANSETRON HCL 4 MG/2ML IJ SOLN
INTRAMUSCULAR | Status: DC | PRN
  Administered 2021-06-30: 4 mg via INTRAVENOUS

## 2021-06-30 MED ORDER — EPHEDRINE 5 MG/ML INJ
INTRAVENOUS | Status: AC
  Filled 2021-06-30: qty 5

## 2021-06-30 MED ORDER — EPHEDRINE SULFATE (PRESSORS) 50 MG/ML IJ SOLN
INTRAMUSCULAR | Status: DC | PRN
  Administered 2021-06-30: 10 mg via INTRAVENOUS

## 2021-06-30 MED ORDER — OXYCODONE HCL 5 MG PO TABS
5.0000 mg | ORAL_TABLET | Freq: Once | ORAL | Status: AC | PRN
  Administered 2021-06-30: 5 mg via ORAL

## 2021-06-30 MED ORDER — MIDAZOLAM HCL 2 MG/2ML IJ SOLN
INTRAMUSCULAR | Status: AC
  Filled 2021-06-30: qty 2

## 2021-06-30 MED ORDER — ONDANSETRON HCL 4 MG/2ML IJ SOLN
INTRAMUSCULAR | Status: AC
  Filled 2021-06-30: qty 2

## 2021-06-30 MED ORDER — FENTANYL CITRATE (PF) 100 MCG/2ML IJ SOLN
INTRAMUSCULAR | Status: DC | PRN
  Administered 2021-06-30: 50 ug via INTRAVENOUS
  Administered 2021-06-30: 25 ug via INTRAVENOUS

## 2021-06-30 MED ORDER — MIDAZOLAM HCL 2 MG/2ML IJ SOLN
1.0000 mg | Freq: Once | INTRAMUSCULAR | Status: AC
  Administered 2021-06-30: 1 mg via INTRAVENOUS

## 2021-06-30 MED ORDER — OXYCODONE HCL 5 MG/5ML PO SOLN: 5.0000 mg | Freq: Once | ORAL | Status: AC | PRN

## 2021-06-30 MED ORDER — BUPIVACAINE-EPINEPHRINE (PF) 0.5% -1:200000 IJ SOLN
INTRAMUSCULAR | Status: DC | PRN
  Administered 2021-06-30: 20 mL via PERINEURAL

## 2021-06-30 MED ORDER — OXYCODONE HCL 5 MG PO TABS: 5.0000 mg | ORAL_TABLET | Freq: Four times a day (QID) | ORAL | 0 refills | Status: DC | PRN

## 2021-06-30 MED ORDER — OXYCODONE HCL 5 MG PO TABS
ORAL_TABLET | ORAL | Status: AC
Start: 2021-06-30 — End: ?
  Filled 2021-06-30: qty 1

## 2021-06-30 MED ORDER — SODIUM CHLORIDE 0.9 % IV SOLN
INTRAVENOUS | Status: AC
  Filled 2021-06-30: qty 10

## 2021-06-30 MED ORDER — DEXAMETHASONE SODIUM PHOSPHATE 10 MG/ML IJ SOLN
INTRAMUSCULAR | Status: AC
  Filled 2021-06-30: qty 1

## 2021-06-30 MED ORDER — BUPIVACAINE-EPINEPHRINE (PF) 0.25% -1:200000 IJ SOLN
INTRAMUSCULAR | Status: DC | PRN
  Administered 2021-06-30: 24 mL

## 2021-06-30 MED ORDER — LIDOCAINE HCL (CARDIAC) PF 100 MG/5ML IV SOSY
PREFILLED_SYRINGE | INTRAVENOUS | Status: DC | PRN
  Administered 2021-06-30: 60 mg via INTRAVENOUS

## 2021-06-30 MED ORDER — ACETAMINOPHEN 500 MG PO TABS
1000.0000 mg | ORAL_TABLET | ORAL | Status: AC
  Administered 2021-06-30: 1000 mg via ORAL

## 2021-06-30 MED ORDER — CLINDAMYCIN PHOSPHATE 900 MG/50ML IV SOLN
900.0000 mg | INTRAVENOUS | Status: AC
  Administered 2021-06-30: 900 mg via INTRAVENOUS

## 2021-06-30 SURGICAL SUPPLY — 46 items
APPLIER CLIP 9.375 MED OPEN (MISCELLANEOUS) ×2
BINDER BREAST LRG (GAUZE/BANDAGES/DRESSINGS) ×1 IMPLANT
BINDER BREAST MEDIUM (GAUZE/BANDAGES/DRESSINGS) IMPLANT
BLADE SURG 15 STRL LF DISP TIS (BLADE) ×1 IMPLANT
BLADE SURG 15 STRL SS (BLADE) ×1
CANISTER SUC SOCK COL 7IN (MISCELLANEOUS) IMPLANT
CANISTER SUCT 1200ML W/VALVE (MISCELLANEOUS) ×2 IMPLANT
CHLORAPREP W/TINT 26 (MISCELLANEOUS) ×2 IMPLANT
CLIP APPLIE 9.375 MED OPEN (MISCELLANEOUS) ×1 IMPLANT
COVER BACK TABLE 60X90IN (DRAPES) ×2 IMPLANT
COVER MAYO STAND STRL (DRAPES) ×2 IMPLANT
COVER PROBE W GEL 5X96 (DRAPES) ×2 IMPLANT
DERMABOND ADVANCED (GAUZE/BANDAGES/DRESSINGS) ×1
DERMABOND ADVANCED .7 DNX12 (GAUZE/BANDAGES/DRESSINGS) ×1 IMPLANT
DRAPE LAPAROSCOPIC ABDOMINAL (DRAPES) ×2 IMPLANT
DRAPE UTILITY XL STRL (DRAPES) ×2 IMPLANT
ELECT COATED BLADE 2.86 ST (ELECTRODE) ×2 IMPLANT
ELECT REM PT RETURN 9FT ADLT (ELECTROSURGICAL) ×2
ELECTRODE REM PT RTRN 9FT ADLT (ELECTROSURGICAL) ×1 IMPLANT
GLOVE SRG 8 PF TXTR STRL LF DI (GLOVE) ×1 IMPLANT
GLOVE SURG POLYISO LF SZ6.5 (GLOVE) ×1 IMPLANT
GLOVE SURG POLYISO LF SZ8 (GLOVE) ×1 IMPLANT
GLOVE SURG UNDER POLY LF SZ7 (GLOVE) ×1 IMPLANT
GLOVE SURG UNDER POLY LF SZ8 (GLOVE) ×1
GOWN STRL REUS W/ TWL LRG LVL3 (GOWN DISPOSABLE) ×2 IMPLANT
GOWN STRL REUS W/ TWL XL LVL3 (GOWN DISPOSABLE) ×1 IMPLANT
GOWN STRL REUS W/TWL LRG LVL3 (GOWN DISPOSABLE) ×1
GOWN STRL REUS W/TWL XL LVL3 (GOWN DISPOSABLE) ×1
HEMOSTAT ARISTA ABSORB 3G PWDR (HEMOSTASIS) IMPLANT
HEMOSTAT SNOW SURGICEL 2X4 (HEMOSTASIS) IMPLANT
KIT MARKER MARGIN INK (KITS) ×2 IMPLANT
NDL HYPO 25X1 1.5 SAFETY (NEEDLE) ×1 IMPLANT
NEEDLE HYPO 25X1 1.5 SAFETY (NEEDLE) ×2 IMPLANT
NS IRRIG 1000ML POUR BTL (IV SOLUTION) ×2 IMPLANT
PACK BASIN DAY SURGERY FS (CUSTOM PROCEDURE TRAY) ×2 IMPLANT
PENCIL SMOKE EVACUATOR (MISCELLANEOUS) ×2 IMPLANT
SLEEVE SCD COMPRESS KNEE MED (STOCKING) ×2 IMPLANT
SPONGE T-LAP 4X18 ~~LOC~~+RFID (SPONGE) ×3 IMPLANT
SUT MNCRL AB 4-0 PS2 18 (SUTURE) ×2 IMPLANT
SUT VICRYL 3-0 CR8 SH (SUTURE) ×2 IMPLANT
SYR CONTROL 10ML LL (SYRINGE) ×2 IMPLANT
TOWEL GREEN STERILE FF (TOWEL DISPOSABLE) ×2 IMPLANT
TRACER MAGTRACE VIAL (MISCELLANEOUS) ×1 IMPLANT
TRAY FAXITRON CT DISP (TRAY / TRAY PROCEDURE) ×2 IMPLANT
TUBE CONNECTING 20X1/4 (TUBING) ×2 IMPLANT
YANKAUER SUCT BULB TIP NO VENT (SUCTIONS) ×2 IMPLANT

## 2021-06-30 NOTE — Anesthesia Postprocedure Evaluation (Signed)
Anesthesia Post Note ? ?Patient: Christina Sanchez ? ?Procedure(s) Performed: LEFT BREAST LUMPECTOMY WITH RADIOACTIVE SEED AND SENTINEL LYMPH NODE BIOPSY (Left: Breast) ? ?  ? ?Patient location during evaluation: PACU ?Anesthesia Type: General ?Level of consciousness: awake and alert ?Pain management: pain level controlled ?Vital Signs Assessment: post-procedure vital signs reviewed and stable ?Respiratory status: spontaneous breathing, nonlabored ventilation and respiratory function stable ?Cardiovascular status: blood pressure returned to baseline ?Postop Assessment: no apparent nausea or vomiting ?Anesthetic complications: no ? ? ?No notable events documented. ? ?Last Vitals:  ?Vitals:  ? 06/30/21 1515 06/30/21 1523  ?BP: (!) 156/91 (!) 141/94  ?Pulse: 70 69  ?Resp: 18 16  ?Temp:    ?SpO2: 93% 93%  ?  ?Last Pain:  ?Vitals:  ? 06/30/21 1500  ?TempSrc:   ?PainSc: 3   ? ? ?  ?  ?  ?  ?  ?  ? ?Marthenia Rolling ? ? ? ? ?

## 2021-06-30 NOTE — H&P (Signed)
History of Present Illness: ?Christina Sanchez is a 71 y.o. female who is seen today as an office consultation for evaluation of Breast Cancer ?.  ? ?Patient presents for evaluation at the [facility] for left breast cancer. She is found to have a mammographic abnormality left breast upper outer quadrant measuring 8 mm. 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 No history of breast pain, nipple discharge or change in the appearance of breast. ? ?Review of Systems: ?A complete review of systems was obtained from the patient. I have reviewed this information and discussed as appropriate with the patient. See HPI as well for other ROS. ? ? ? ?Medical History: ?Past Medical History:  ?Diagnosis Date  ? Anxiety  ? Arthritis  ? Hyperlipidemia  ? Hypertension  ? ?Patient Active Problem List  ?Diagnosis  ? Malignant neoplasm of upper-outer quadrant of left breast in female, estrogen receptor positive (CMS-HCC)  ? Anxiety  ? ?Past Surgical History:  ?Procedure Laterality Date  ? Fistula Track Removal N/A  ?Date Unknown  ? HYSTERECTOMY N/A  ?Partial Date Unknown  ? ? ?Allergies  ?Allergen Reactions  ? Aspirin Other (See Comments)  ?"burning feeling in my stomach". She can tolerate 1 Aleve. ? ? ?Current Outpatient Medications on File Prior to Visit  ?Medication Sig Dispense Refill  ? zolpidem (AMBIEN) 10 mg tablet 1 tablet at bedtime as needed  ? amLODIPine-benazepril (LOTREL) 5-10 mg capsule Take 1 capsule by mouth once daily  ? tiZANidine (ZANAFLEX) 4 MG tablet Take 4 mg by mouth 3 (three) times daily as needed  ? ?No current facility-administered medications on file prior to visit.  ? ?Family History  ?Problem Relation Age of Onset  ? Stroke Mother  ? High blood pressure (Hypertension) Mother  ? Stroke Father  ? High blood pressure (Hypertension) Father  ? High blood pressure (Hypertension) Sister  ? High blood pressure (Hypertension) Brother  ? Diabetes Brother  ? ? ?Social  History  ? ?Tobacco Use  ?Smoking Status Every Day  ? Packs/day: 0.50  ? Types: Cigarettes  ?Smokeless Tobacco Never  ? ? ?Social History  ? ?Socioeconomic History  ? Marital status: Married  ?Tobacco Use  ? Smoking status: Every Day  ?Packs/day: 0.50  ?Types: Cigarettes  ? Smokeless tobacco: Never  ?Vaping Use  ? Vaping Use: Never used  ?Substance and Sexual Activity  ? Alcohol use: Yes  ?Alcohol/week: 2.0 standard drinks  ?Types: 2 Glasses of wine per week  ? Drug use: Never  ? ?Objective:  ?There were no vitals filed for this visit.  ?There is no height or weight on file to calculate BMI. ? ?Physical Exam ?Constitutional:  ?Appearance: Normal appearance.  ?HENT:  ?Head: Normocephalic.  ?Eyes:  ?Pupils: Pupils are equal, round, and reactive to light.  ?Cardiovascular:  ?Rate and Rhythm: Normal rate.  ?Pulmonary:  ?Effort: Pulmonary effort is normal.  ?Breath sounds: No stridor.  ?Chest:  ?Breasts: ?Right: No bleeding, inverted nipple, mass or nipple discharge.  ?Left: No bleeding, inverted nipple, mass or nipple discharge.  ?Musculoskeletal:  ?General: Normal range of motion.  ?Cervical back: Normal range of motion.  ?Lymphadenopathy:  ?Upper Body:  ?Right upper body: No supraclavicular or axillary adenopathy.  ?Left upper body: Axillary adenopathy present. No supraclavicular adenopathy.  ?Skin: ?General: Skin is warm.  ?Neurological:  ?General: No focal deficit present.  ?Mental Status: She is alert and oriented to person, place, and time.  ?Psychiatric:  ?Mood and Affect: Mood normal.  ?  Behavior: Behavior normal.  ? ? ? ?Labs, Imaging and Diagnostic Testing: ?ADDITIONAL INFORMATION: ?PROGNOSTIC INDICATORS ?Results: ?IMMUNOHISTOCHEMICAL AND MORPHOMETRIC ANALYSIS PERFORMED MANUALLY ?The tumor cells are NEGATIVE for Her2 (1+). ?Estrogen Receptor: 95%, POSITIVE, STRONG STAINING INTENSITY ?Progesterone Receptor: 2%, POSITIVE, STRONG STAINING INTENSITY ?Proliferation Marker Ki67: 2% ?REFERENCE RANGE ESTROGEN  RECEPTOR ?NEGATIVE 0% ?POSITIVE =>1% ?REFERENCE RANGE PROGESTERONE RECEPTOR ?NEGATIVE 0% ?POSITIVE =>1% ?All controls stained appropriately ?Thressa Sheller MD ?Pathologist, Electronic Signature ?( Signed 06/09/2021) ?An immunohistochemical stain for E-cadherin is performed with adequate control. The E-cadherin is negative within ?the invasive tumor and within all of the in situ carcinoma. 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 ?No high-grade ductal carcinoma in situ is evident with the results of this immunohistochemical stain. ?Tobin Chad MD ?Pathologist, Electronic Signature ? ?CLINICAL DATA: Recall from screening mammography, possible focal ?asymmetry involving the outer LEFT breast at middle depth. ?  ?EXAM: ?DIGITAL DIAGNOSTIC UNILATERAL LEFT MAMMOGRAM WITH TOMOSYNTHESIS AND ?CAD; ULTRASOUND LEFT BREAST LIMITED ?  ?TECHNIQUE: ?Left digital diagnostic mammography and breast tomosynthesis was ?performed. The images were evaluated with computer-aided detection.; ?Targeted ultrasound examination of the left breast was performed. ?  ?COMPARISON: Previous exam(s). ?  ?ACR Breast Density Category c: The breast tissue is heterogeneously ?dense, which may obscure small masses. ?  ?FINDINGS: ?Spot-compression CC and MLO views of the area of concern were ?obtained. ?  ?The focal asymmetry in the outer breast at middle depth persists on ?the spot compression views. There is no underlying architectural ?distortion or suspicious calcifications. ?  ?Targeted ultrasound is performed, demonstrating an irregular ?hypoechoic mass with angular margins at the 10 o'clock position 2 cm ?from nipple measuring approximately 8 x 5 x 8 mm, demonstrating ?posterior acoustic shadowing and demonstrating peripheral power ?Doppler flow, located within dense fibroglandular tissue, ?corresponding to the screening  mammographic finding. ?  ?Sonographic evaluation of the LEFT axilla demonstrates no pathologic ?lymphadenopathy. ?  ?On correlative physical examination, there is vague palpable ?firmness in the upper-outer periareolar location at the site of the ?sonographic mass. ?  ?IMPRESSION: ?1. Highly suspicious 8 mm mass in the UPPER OUTER QUADRANT of the ?LEFT breast at the 10 o'clock position 2 cm from nipple. ?2. No pathologic LEFT axillary lymphadenopathy. ?  ?RECOMMENDATION: ?Ultrasound-guided core needle biopsy of the suspicious LEFT breast ?mass. ?  ?I have discussed the findings and recommendations with the patient. ?The ultrasound core needle biopsy procedure was discussed with the ?patient and her questions were answered. She wishes to proceed with ?the biopsy which has been scheduled by the Breast Imaging staff. ?  ?BI-RADS CATEGORY 5: Highly suggestive of malignancy. ?  ?Electronically Signed: ?By: Evangeline Dakin M.D. ?On: 05/23/2021 08:32 ? ?Assessment and Plan:  ? ?Diagnoses and all orders for this visit: ? ?Malignant neoplasm of upper-outer quadrant of left breast in female, estrogen receptor positive (CMS-HCC) ? ?Discussed breast conserving surgery, mastectomy reconstruction and sentinel lymph node mapping. Risks and benefits of all types of options discussed. On exam she had adenopathy in her left axilla which is mild. I recommend left breast lumpectomy with sentinel lymph node mapping despite her normal ultrasound. He would like to conserve her breast. Risk and benefits of surgery discussed. Long-term expectations of surgery discussed. Survival and local regional recurrence discussed. I will hold off on MRI at this point since he is quite small.The procedure has been discussed with the patient. Alternatives to surgery have been discussed  with the patient. Risks of surgery include bleeding, Infection, Seroma formation, death, and the need for further surgery. The patient understands and wishes to proceed.  Discussed potential lymphedema and arm stiffness from sent lymph node mapping. Risk and benefits of this discussed. Complications of bleeding, infection, lymphedema, cosmetic deformity, poor wound healing, injury

## 2021-06-30 NOTE — Transfer of Care (Signed)
Immediate Anesthesia Transfer of Care Note ? ?Patient: Christina Sanchez ? ?Procedure(s) Performed: LEFT BREAST LUMPECTOMY WITH RADIOACTIVE SEED AND SENTINEL LYMPH NODE BIOPSY (Left: Breast) ? ?Patient Location: PACU ? ?Anesthesia Type:GA combined with regional for post-op pain ? ?Level of Consciousness: drowsy ? ?Airway & Oxygen Therapy: Patient Spontanous Breathing and Patient connected to face mask oxygen ? ?Post-op Assessment: Report given to RN and Post -op Vital signs reviewed and stable ? ?Post vital signs: Reviewed and stable ? ?Last Vitals:  ?Vitals Value Taken Time  ?BP 165/92 06/30/21 1449  ?Temp    ?Pulse 76 06/30/21 1451  ?Resp 17 06/30/21 1451  ?SpO2 100 % 06/30/21 1451  ?Vitals shown include unvalidated device data. ? ?Last Pain:  ?Vitals:  ? 06/30/21 1132  ?TempSrc: Oral  ?PainSc: 0-No pain  ?   ? ?  ? ?Complications: No notable events documented. ?

## 2021-06-30 NOTE — Anesthesia Preprocedure Evaluation (Addendum)
Anesthesia Evaluation  ?Patient identified by MRN, date of birth, ID band ?Patient awake ? ? ? ?Reviewed: ?Allergy & Precautions, NPO status , Patient's Chart, lab work & pertinent test results ? ?History of Anesthesia Complications ?Negative for: history of anesthetic complications ? ?Airway ?Mallampati: II ? ?TM Distance: >3 FB ?Neck ROM: Full ? ? ? Dental ? ?(+) Missing,  ?  ?Pulmonary ?Current Smoker,  ?  ?Pulmonary exam normal ? ? ? ? ? ? ? Cardiovascular ?hypertension, Pt. on medications ?Normal cardiovascular exam ? ? ?  ?Neuro/Psych ?negative neurological ROS ? negative psych ROS  ? GI/Hepatic ?negative GI ROS, Neg liver ROS,   ?Endo/Other  ?negative endocrine ROS ? Renal/GU ?negative Renal ROS  ?negative genitourinary ?  ?Musculoskeletal ? ?(+) Arthritis ,  ? Abdominal ?  ?Peds ? Hematology ?negative hematology ROS ?(+)   ?Anesthesia Other Findings ?Left breast ca ? Reproductive/Obstetrics ?negative OB ROS ? ?  ? ? ? ? ? ? ? ? ? ? ? ? ? ?  ?  ? ? ? ? ? ? ?Anesthesia Physical ?Anesthesia Plan ? ?ASA: 2 ? ?Anesthesia Plan: General  ? ?Post-op Pain Management: Tylenol PO (pre-op)* and Regional block*  ? ?Induction: Intravenous ? ?PONV Risk Score and Plan: 3 and Treatment may vary due to age or medical condition, Ondansetron, Dexamethasone and Midazolam ? ?Airway Management Planned: LMA ? ?Additional Equipment: None ? ?Intra-op Plan:  ? ?Post-operative Plan: Extubation in OR ? ?Informed Consent: I have reviewed the patients History and Physical, chart, labs and discussed the procedure including the risks, benefits and alternatives for the proposed anesthesia with the patient or authorized representative who has indicated his/her understanding and acceptance.  ? ? ? ?Dental advisory given ? ?Plan Discussed with: CRNA ? ?Anesthesia Plan Comments:   ? ? ? ? ? ?Anesthesia Quick Evaluation ? ?

## 2021-06-30 NOTE — Op Note (Signed)
Preoperative diagnosis: Stage I left breast cancer upper outer quadrant ER positive ? ?Postoperative diagnosis: Same ? ?Procedure: Left breast seed localized lumpectomy with left axillary sentinel lymph node mapping ? ?Surgeon: Erroll Luna, MD ? ?Anesthesia: LMA with pectoral block and 0.25% Marcaine plain ? ?EBL: Minimal ? ?Specimen: Left breast tissue with seed and clip verified by Faxitron plus additional margins.  All tissue oriented with ink.  Also, 3 additional left axillary node sentinel nodes ? ?Drains: None ? ?Indications for procedure: The patient is a 71 year old female with stage I left breast cancer.  She is presenting for breast conserving surgery after being seen in the multidisciplinary clinic and all options discussed.  Risk and benefits of surgery reviewed as well as comparison of surgical treatments with respect to local regional recurrence, metastatic disease and quality of life.The procedure has been discussed with the patient. Alternatives to surgery have been discussed with the patient.  Risks of surgery include bleeding,  Infection,  Seroma formation, death,  and the need for further surgery.   The patient understands and wishes to proceed. Sentinel lymph node mapping and dissection has been discussed with the patient.  Risk of bleeding,  Infection,  arm swelling  Seroma formation,  Additional procedures,,  Shoulder weakness ,  Shoulder stiffness,  Nerve and blood vessel injury and reaction to the mapping dyes have been discussed.  Alternatives to surgery have been discussed with the patient.  The patient agrees to proceed.  ? ? ?Description of procedure: The patient was met in the holding area and questions were answered.  She underwent pectoral block.  She also had a seed placed as an outpatient by the radiologist.  All questions were answered.  Left side was marked as correct.  She was then taken back to the operating room.  She was placed upon upon the OR table.  After induction of  general esthesia, left breast was prepped in a sterile fashion and timeout was performed.  2 cc of mag tracer injected in a subareolar position and massaged for 5 minutes.  She was then prepped and draped in a sterile fashion a second time.  Timeout was done.  Proper patient, site and procedure verified and she received appropriate preoperative antibiotics.  Neoprobe was used to identify the seed and the films were available for review.  Curvilinear incision was made around the lateral border of the nipple areolar complex.  All tissue and the seed and clip were excised with a grossly negative margin.  Due to the lobular nature of her cancer, I did shave all the margins and sent them separately.  The Faxitron revealed the seed and clip be present.  Irrigation was used.  Local anesthetic was infiltrated throughout the cavity.  Cavity was then clipped.  It was then closed with 3-0 Vicryl and 4 Monocryl. ? ?The mag trace probe was used to identify hotspot in the left axilla.  Local anesthetic was infiltrated along the inferior border of the axillary hairline.  Incision was made a 4 cm.  Dissection was carried down to the level 1 contents and 3 hot nodes were identified and removed.  These appear grossly normal.  There were no obvious palpable nodes in the axilla.  Background counts approached baseline with the help of the probe.  Cavities irrigated.  The long thoracic nerve, thoracodorsal trunk and extra vein were preserved.  I then closed the cavity deep space with 3-0 Vicryl.  4 Monocryl was used to close the skin in a  subcuticular fashion.  Dermabond applied to both incisions.  Breast binder placed.  All counts found to be correct.  The patient was awoke extubated taken to recovery in satisfactory condition. ?

## 2021-06-30 NOTE — Interval H&P Note (Signed)
History and Physical Interval Note: ? ?06/30/2021 ?1:05 PM ? ?Christina Sanchez  has presented today for surgery, with the diagnosis of LEFT BREAST CANCER.  The various methods of treatment have been discussed with the patient and family. After consideration of risks, benefits and other options for treatment, the patient has consented to  Procedure(s): ?LEFT BREAST LUMPECTOMY WITH RADIOACTIVE SEED AND SENTINEL LYMPH NODE BIOPSY (Left) as a surgical intervention.  The patient's history has been reviewed, patient examined, no change in status, stable for surgery.  I have reviewed the patient's chart and labs.  Questions were answered to the patient's satisfaction.   ? ? ?[name] A Kyrin Gratz ? ? ?

## 2021-06-30 NOTE — Anesthesia Procedure Notes (Signed)
Procedure Name: LMA Insertion ?Date/Time: 06/30/2021 1:30 PM ?Performed by: Tawni Millers, CRNA ?Pre-anesthesia Checklist: Patient identified, Emergency Drugs available, Suction available and Patient being monitored ?Patient Re-evaluated:Patient Re-evaluated prior to induction ?Oxygen Delivery Method: Circle system utilized ?Preoxygenation: Pre-oxygenation with 100% oxygen ?Induction Type: IV induction ?Ventilation: Mask ventilation without difficulty ?LMA: LMA inserted ?LMA Size: 4.0 ?Number of attempts: 1 ?Airway Equipment and Method: Bite block ?Placement Confirmation: positive ETCO2 ?Tube secured with: Tape ?Dental Injury: Teeth and Oropharynx as per pre-operative assessment  ? ? ? ? ?

## 2021-06-30 NOTE — Anesthesia Procedure Notes (Signed)
Anesthesia Regional Block: Pectoralis block  ? ?Pre-Anesthetic Checklist: , timeout performed,  Correct Patient, Correct Site, Correct Laterality,  Correct Procedure, Correct Position, site marked,  Risks and benefits discussed,  Pre-op evaluation,  At surgeon's request and post-op pain management ? ?Laterality: Left ? ?Prep: Maximum Sterile Barrier Precautions used, chloraprep     ?  ?Needles:  ?Injection technique: Single-shot ? ?Needle Type: Echogenic Stimulator Needle   ? ? ?Needle Length: 9cm  ?Needle Gauge: 22  ? ? ? ?Additional Needles: ? ? ?Procedures:,,,, ultrasound used (permanent image in chart),,    ?Narrative:  ?Start time: 06/30/2021 12:12 PM ?End time: 06/30/2021 12:15 PM ?Injection made incrementally with aspirations every 5 mL. ? ?Performed by: Personally  ?Anesthesiologist: Brennan Bailey, MD ? ?Additional Notes: ?Risks, benefits, and alternative discussed. Patient gave consent for procedure. Patient prepped and draped in sterile fashion. Sedation administered, patient remains easily responsive to voice. Relevant anatomy identified with ultrasound guidance. Local anesthetic given in 5cc increments with no signs or symptoms of intravascular injection. No pain or paraesthesias with injection. Patient monitored throughout procedure with signs of LAST or immediate complications. Tolerated well. Ultrasound image placed in chart.  ?Tawny Asal, MD ? ? ? ? ? ? ?

## 2021-06-30 NOTE — Progress Notes (Signed)
Assisted Dr. Daiva Huge with left, ultrasound guided, pectoralis block. Side rails up, monitors on throughout procedure. See vital signs in flow sheet. Tolerated Procedure well. ?

## 2021-06-30 NOTE — Discharge Instructions (Addendum)
[facility] ?Office Phone Number [phone] ? ?BREAST BIOPSY/ PARTIAL MASTECTOMY: POST OP INSTRUCTIONS ? ?Always review your discharge instruction sheet given to you by the facility where your surgery was performed. ? ?IF YOU HAVE DISABILITY OR FAMILY LEAVE FORMS, YOU MUST BRING THEM TO THE OFFICE FOR PROCESSING.  DO NOT GIVE THEM TO YOUR DOCTOR. ? ?A prescription for pain medication may be given to you upon discharge.  Take your pain medication as prescribed, if needed.  If narcotic pain medicine is not needed, then you may take acetaminophen (Tylenol) or ibuprofen (Advil) as needed. *You had 1000 mg of Tylenol at 11:36 am ?Take your usually prescribed medications unless otherwise directed ?If you need a refill on your pain medication, please contact your pharmacy.  They will contact our office to request authorization.  Prescriptions will not be filled after 5pm or on week-ends. ?You should eat very light the first 24 hours after surgery, such as soup, crackers, pudding, etc.  Resume your normal diet the day after surgery. ?Most patients will experience some swelling and bruising in the breast.  Ice packs and a good support bra will help.  Swelling and bruising can take several days to resolve.  ?It is common to experience some constipation if taking pain medication after surgery.  Increasing fluid intake and taking a stool softener will usually help or prevent this problem from occurring.  A mild laxative (Milk of Magnesia or Miralax) should be taken according to package directions if there are no bowel movements after 48 hours. ?Unless discharge instructions indicate otherwise, you may remove your bandages 24-48 hours after surgery, and you may shower at that time.  You may have steri-strips (small skin tapes) in place directly over the incision.  These strips should be left on the skin for 7-10 days.  If your surgeon used skin glue on the incision, you may shower in 24 hours.  The glue will  flake off over the next 2-3 weeks.  Any sutures or staples will be removed at the office during your follow-up visit. ?ACTIVITIES:  You may resume regular daily activities (gradually increasing) beginning the next day.  Wearing a good support bra or sports bra minimizes pain and swelling.  You may have sexual intercourse when it is comfortable. ?You may drive when you no longer are taking prescription pain medication, you can comfortably wear a seatbelt, and you can safely maneuver your car and apply brakes. ?RETURN TO WORK:  ______________________________________________________________________________________ ?You should see your doctor in the office for a follow-up appointment approximately two weeks after your surgery.  Your doctor?s nurse will typically make your follow-up appointment when she calls you with your pathology report.  Expect your pathology report 2-3 business days after your surgery.  You may call to check if you do not hear from [place] after three days. ?OTHER INSTRUCTIONS: _______________________________________________________________________________________________ _____________________________________________________________________________________________________________________________________ ?_____________________________________________________________________________________________________________________________________ ?_____________________________________________________________________________________________________________________________________ ? ?WHEN TO CALL YOUR DOCTOR: ?Fever over 101.0 ?Nausea and/or vomiting. ?Extreme swelling or bruising. ?Continued bleeding from incision. ?Increased pain, redness, or drainage from the incision. ? ?The clinic staff is available to answer your questions during regular business hours.  Please don?t hesitate to call and ask to speak to one of the nurses for clinical concerns.  If you have a medical emergency, go to the nearest emergency room  or call 911.  A surgeon from [facility] is always on call at the hospital. ? ?For further questions, please visit [email or web address]   ? ? ?Post Anesthesia Home Care Instructions ? ?  Activity: ?Get plenty of rest for the remainder of the day. A responsible individual must stay with you for 24 hours following the procedure.  ?For the next 24 hours, DO NOT: ?-Drive a car ?-Paediatric nurse ?-Drink alcoholic beverages ?-Take any medication unless instructed by your physician ?-Make any legal decisions or sign important papers. ? ?Meals: ?Start with liquid foods such as gelatin or soup. Progress to regular foods as tolerated. Avoid greasy, spicy, heavy foods. If nausea and/or vomiting occur, drink only clear liquids until the nausea and/or vomiting subsides. Call your physician if vomiting continues. ? ?Special Instructions/Symptoms: ?Your throat may feel dry or sore from the anesthesia or the breathing tube placed in your throat during surgery. If this causes discomfort, gargle with warm salt water. The discomfort should disappear within 24 hours. ? ?If you had a scopolamine patch placed behind your ear for the management of post- operative nausea and/or vomiting: ? ?1. The medication in the patch is effective for 72 hours, after which it should be removed.  Wrap patch in a tissue and discard in the trash. Wash hands thoroughly with soap and water. ?2. You may remove the patch earlier than 72 hours if you experience unpleasant side effects which may include dry mouth, dizziness or visual disturbances. ?3. Avoid touching the patch. Wash your hands with soap and water after contact with the patch. ?    ?

## 2021-07-01 ENCOUNTER — Encounter (HOSPITAL_BASED_OUTPATIENT_CLINIC_OR_DEPARTMENT_OTHER): Payer: Self-pay | Admitting: Surgery

## 2021-07-03 LAB — SURGICAL PATHOLOGY

## 2021-07-06 ENCOUNTER — Encounter: Payer: Self-pay | Admitting: *Deleted

## 2021-07-06 DIAGNOSIS — Z170 Estrogen receptor positive status [ER+]: Secondary | ICD-10-CM

## 2021-07-08 ENCOUNTER — Encounter: Payer: Self-pay | Admitting: *Deleted

## 2021-07-11 DIAGNOSIS — C50919 Malignant neoplasm of unspecified site of unspecified female breast: Secondary | ICD-10-CM

## 2021-07-11 HISTORY — DX: Malignant neoplasm of unspecified site of unspecified female breast: C50.919

## 2021-07-15 ENCOUNTER — Encounter (HOSPITAL_COMMUNITY): Payer: Self-pay

## 2021-07-16 ENCOUNTER — Other Ambulatory Visit (HOSPITAL_COMMUNITY): Payer: Self-pay | Admitting: Family Medicine

## 2021-07-16 DIAGNOSIS — R0609 Other forms of dyspnea: Secondary | ICD-10-CM

## 2021-07-16 DIAGNOSIS — R6889 Other general symptoms and signs: Secondary | ICD-10-CM

## 2021-07-26 NOTE — Therapy (Signed)
?OUTPATIENT PHYSICAL THERAPY BREAST CANCER POST OP FOLLOW UP ? ? ?Patient Name: Christina Sanchez ?MRN: [id number] ?DOB:Aug 14, 1950, 71 y.o., female ?Today's Date: 07/27/2021 ? ? PT End of Session - 07/27/21 1050   ? ? Visit Number 2   ? Number of Visits 2   ? PT Start Time [id number]   ? PT Stop Time 1050   ? PT Time Calculation (min) 55 min   ? Activity Tolerance Patient tolerated treatment well   ? Behavior During Therapy [facility] for tasks assessed/performed   ? ?  ?  ? ?  ? ? ?Past Medical History:  ?Diagnosis Date  ? Arthritis   ? Bilateral occipital neuralgia   ? Hypertension   ? ?Past Surgical History:  ?Procedure Laterality Date  ? BREAST LUMPECTOMY WITH RADIOACTIVE SEED AND SENTINEL LYMPH NODE BIOPSY Left 06/30/2021  ? Procedure: LEFT BREAST LUMPECTOMY WITH RADIOACTIVE SEED AND SENTINEL LYMPH NODE BIOPSY;  Surgeon: Christina Luna, MD;  Location: [facility];  Service: General;  Laterality: Left;  ? fistula track removal    ? rectal  ? FOOT SURGERY    ? PARTIAL HYSTERECTOMY    ? ?Patient Active Problem List  ? Diagnosis Date Noted  ? Malignant neoplasm of upper-outer quadrant of left breast in female, estrogen receptor positive ([place]) 06/08/2021  ? Occipital neuralgia of right side 05/09/2019  ? Myofascial pain syndrome, cervical 05/09/2019  ? Occipital neuralgia of left side 02/01/2019  ? ? ?PCP: Christina Pardon, MD ? ?REFERRING PROVIDER: Erroll Luna, MD ? ?REFERRING DIAG: Left breast cancer ? ?THERAPY DIAG:  ?Malignant neoplasm of upper-outer quadrant of left breast in female, estrogen receptor positive ([place]) ? ?Abnormal posture ? ?Aftercare following surgery for neoplasm ? ?ONSET DATE: 06/30/2021 ? ?SUBJECTIVE:                                                                                                                                                                                          ? ?SUBJECTIVE STATEMENT: ?Patient reports she underwent a left lumpectomy and sentinel node biopsy (4  negative nodes) on 06/30/2021. She goes to her radiation oncologist on 07/30/2021. ? 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 She underwent a left lumpectomy and sentinel node biopsy (4 negative nodes) on 06/30/2021. It is ER positive, PR and HER2 negative with a Ki67 of 2%.  ? ?PATIENT GOALS:  Reassess how my recovery is going related to arm function, pain, and swelling. ? ?PAIN:  ?Are you having pain? Yes: NPRS scale: 6/10 ?Pain location: Left axilla and lateral breast ?Pain description: hardness  and throbbing ?Aggravating factors: nothing ?Relieving factors: nothing ? ?PRECAUTIONS: Recent Surgery, left UE Lymphedema risk ? ?ACTIVITY LEVEL / LEISURE: She is not exercising ? ? ?OBJECTIVE:  ? ?PATIENT SURVEYS:  ?QUICK DASH: ? Christina Sanchez - 07/27/21 0001   ? ? Open a tight or new jar Mild difficulty   ? Do heavy household chores (wash walls, wash floors) No difficulty   ? Carry a shopping bag or briefcase No difficulty   ? Wash your back No difficulty   ? Use a knife to cut food No difficulty   ? Recreational activities in which you take some force or impact through your arm, shoulder, or hand (golf, hammering, tennis) No difficulty   ? During the past week, to what extent has your arm, shoulder or hand problem interfered with your normal social activities with family, friends, neighbors, or groups? Not at all   ? During the past week, to what extent has your arm, shoulder or hand problem limited your work or other regular daily activities Not at all   ? Arm, shoulder, or hand pain. None   ? Tingling (pins and needles) in your arm, shoulder, or hand None   ? Difficulty Sleeping No difficulty   ? DASH Score 2.27 %   ? ?  ?  ? ?  ? ? ? ?OBSERVATIONS: ? Left breast and axillary incision both appear to be well healed. Glue still present on left breast incision with scar tissue tightness present in both areas. Mild edema present just  superior to left breast incision with hardness. Issued compression foam and encouraged her to wear this under a sports bra. ? ?POSTURE:  ?Forward head and rounded shoulders posture ?  ?UPPER EXTREMITY AROM/PROM: ?  ?A/PROM RIGHT  06/10/2021 ?   ?Shoulder extension 60  ?Shoulder flexion 148  ?Shoulder abduction 145  ?Shoulder internal rotation 67  ?Shoulder external rotation 87  ?                        (Blank rows = not tested) ?  ?A/PROM LEFT  06/10/2021 LEFT 07/27/2021  ?Shoulder extension 53 68  ?Shoulder flexion 134 134  ?Shoulder abduction 133 152  ?Shoulder internal rotation 40 71  ?Shoulder external rotation 72 79  ?                        (Blank rows = not tested) ?  ?  ?CERVICAL AROM: ?All within normal limits:  ?  ?  Percent limited  ?Flexion WNL  ?Extension 75% limited  ?Right lateral flexion 25% limited  ?Left lateral flexion 50% limited  ?Right rotation 75% limited  ?Left rotation            50% limited  ?             ?  ?  ?UPPER EXTREMITY STRENGTH: WFL ?  ?  ?LYMPHEDEMA ASSESSMENTS:  ?  ?Impact RIGHT  06/10/2021 RIGHT 07/27/2021  ?10 cm proximal to olecranon process 22.7 23.8  ?Olecranon process 22.1 22.7  ?10 cm proximal to ulnar styloid process 18.6 18.9  ?Just proximal to ulnar styloid process 14.9 15  ?Across hand at thumb web space 19 19.7  ?At base of 2nd digit 6 6.2  ?(Blank rows = not tested) ?  ?[place] LEFT  06/10/2021 LEFT 07/27/2021  ?10 cm proximal to olecranon process 23.8 24.5  ?Olecranon process 22.1 22.2  ?10 cm proximal to ulnar  styloid process 18.3 18.3  ?Just proximal to ulnar styloid process 14.4 14.5  ?Across hand at thumb web space 18.4 19.3  ?At base of 2nd digit 5.8 5.8  ?(Blank rows = not tested) ?  ? ? ?  ?Surgery type/Date: 06/30/2021 left lumpectomy and sentinel node biopsy ?Number of lymph nodes removed: 4 ?Current/past treatment (chemo, radiation, hormone therapy): none ?Other symptoms:  ?Heaviness/tightness Yes ?Pain Yes ?Pitting edema No ?Infections No ?Decreased scar mobility  Yes ?Stemmer sign No ? ? ?PATIENT EDUCATION:  ?Education details: Reviewed HEP and pt independent ?Person educated: Patient ?Education method: Explanation, Demonstration, and Verbal cues ?Education comprehension: verbalized understanding ? ? ?HOME EXERCISE PROGRAM: ? Reviewed previously given post op HEP. ? ?ASSESSMENT: ? ?CLINICAL IMPRESSION: ?Patient is doing very well s/p left lumpectomy and sentinel node biopsy (4 negative nodes) on 06/30/2021. She has regained full shoulder ROM and function, shows no signs of lymphedema and her incisions are healing well. She does have moderate edema present just superior to her incision site which feels hard in nature. A compression foam piece was given to pt in a stockinette and she was encouraged to place that in her compression bra at the area of edema. She plans to attend the After Breast Cancer class on 08/10/2021 but otherwise has no PT needs at this time. She will return for SOZO screens.  ? ?Pt will benefit from skilled therapeutic intervention to improve on the following deficits: Decreased knowledge of precautions, impaired UE functional use, pain, decreased ROM, postural dysfunction.  ? ?PT treatment/interventions: ADL/Self care home management, Therapeutic exercises, Therapeutic activity, Neuromuscular re-education, Balance training, Gait training, Patient/Family education, and Joint mobilization ? ? ? ? ?GOALS: ?Goals reviewed with patient? Yes ? ?LONG TERM GOALS:  (STG=LTG) ? ?GOALS Name Target Date Goal status  ?1 Pt will demonstrate she has regained full shoulder ROM and function post operatively compared to baselines.  ?Baseline: 08/05/2021 MET  ? ? ? ?PLAN: ?PT FREQUENCY/DURATION: N/A ? ?PLAN FOR NEXT SESSION: SOZO screens ? ? ?[facility] ? [address], [address] ? [place] [address] ? [phone] ? ?After Breast Cancer Class ?It is recommended you attend the ABC class to be educated on lymphedema risk reduction. This class is free of  charge and lasts for 1 hour. It is a 1-time class. You will need to download the Webex app either on your phone or computer. We will send you a link the night before or the morning of the class. You should be able

## 2021-07-27 ENCOUNTER — Ambulatory Visit: Payer: Medicare Other | Attending: Surgery | Admitting: Physical Therapy

## 2021-07-27 ENCOUNTER — Encounter: Payer: Self-pay | Admitting: Licensed Clinical Social Worker

## 2021-07-27 DIAGNOSIS — Z483 Aftercare following surgery for neoplasm: Secondary | ICD-10-CM | POA: Diagnosis present

## 2021-07-27 DIAGNOSIS — Z170 Estrogen receptor positive status [ER+]: Secondary | ICD-10-CM | POA: Insufficient documentation

## 2021-07-27 DIAGNOSIS — R293 Abnormal posture: Secondary | ICD-10-CM | POA: Diagnosis present

## 2021-07-27 DIAGNOSIS — C50412 Malignant neoplasm of upper-outer quadrant of left female breast: Secondary | ICD-10-CM | POA: Insufficient documentation

## 2021-07-27 NOTE — Patient Instructions (Signed)
[facility] ? [address], [address] ? [place] [address] ? [phone] ? ?After Breast Cancer Class ?It is recommended you attend the ABC class to be educated on lymphedema risk reduction. This class is free of charge and lasts for 1 hour. It is a 1-time class. You will need to download the Webex app either on your phone or computer. We will send you a link the night before or the morning of the class. You should be able to click on that link to join the class. This is not a confidential class. You don't have to turn your camera on, but other participants may be able to see your email address. You are scheduled for May 1st at 11:00.N/A ? ?Scar massage ?You can begin gentle scar massage to you incision sites. Gently place one hand on the incision and move the skin (without sliding on the skin) in various directions. Do this for a few minutes and then you can gently massage either coconut oil or vitamin E cream into the scars. ? ?Compression garment ?You should continue wearing your compression bra until you feel like you no longer have swelling. ? ?Home exercise Program ?Continue doing the exercises you were given until you feel like you can do them without feeling any tightness at the end.  ? ?Walking Program ?Studies show that 30 minutes of walking per day (fast enough to elevate your heart rate) can significantly reduce the risk of a cancer recurrence. If you can't walk due to other medical reasons, we encourage you to find another activity you could do (like a stationary bike or water exercise). ? ?Posture ?After breast cancer surgery, people frequently sit with rounded shoulders posture because it puts their incisions on slack and feels better. If you sit like this and scar tissue forms in that position, you can become very tight and have pain sitting or standing with good posture. Try to be aware of your posture and sit and stand up tall to heal properly. ? ?Follow up PT: ?It is  recommended you return every 3 months for the first 3 years following surgery to be assessed on the SOZO machine for an L-Dex score. This helps prevent clinically significant lymphedema in 95% of patients. These follow up screens are 10 minute appointments that you are not billed for. You are scheduled for June 26th at 8:20am ? ?

## 2021-07-27 NOTE — Progress Notes (Signed)
[facility] CSW Progress Note ? ?Clinical Social Worker contacted caregiver by phone to discuss financial concerns.  Pt reports she is retired and living on a fixed income.  Pt has undergone surgery and is now being referred for radiation which has raised concern for pt with regards to how much she may be responsible for financially.  CSW referred pt to financial services and scheduled to meet in person with pt 4/20 to complete [employer].  CSW will continue to follow as appropriate.   ? ? ? ?Christina Sanchez , LCSW ?

## 2021-07-28 NOTE — Progress Notes (Incomplete Revision)
?Radiation Oncology         [phone] ?________________________________ ? ?Name: Christina Sanchez        MRN: [id number]  ?Date of Service: 07/30/2021 DOB: 07/30/1950 ? ?VU:DTHYHOO, Christina Sanchez  Christina Merle, Sanchez    ? ?REFERRING PHYSICIAN: Truitt Merle, Sanchez ? ? ?DIAGNOSIS: The encounter diagnosis was Malignant neoplasm of upper-outer quadrant of left breast in female, estrogen receptor positive ([place]). ? ? ?HISTORY OF PRESENT ILLNESS: Christina Sanchez is a 71 y.o. female originally seen in the multidisciplinary breast clinic for a new diagnosis of left breast cancer. The patient was noted to have screening detected mass in the left breast.  Diagnostic imaging by ultrasound measured a mass in the 10 o'clock position up to 8 Sanchez in greatest dimension with no evidence of left axillary adenopathy.  She underwent biopsy of this on 06/04/2021 which revealed grade 1 invasive mammary carcinoma with focal ductal and lobular carcinoma in situ, overall her phenotype is felt to be consistent with Lobular carcinoma and LCIS.  Her cancer was ER/PR positive, HER2 negative and Ki-67 was 2%.   ? ?Since her last visit, she underwent a left breast lumpectomy on 06/30/2021 that showed a grade 2 invasive lobular carcinoma measuring 8 Sanchez in greatest dimension.  Margins were negative but 1 Sanchez to the lateral margin and 3 Sanchez for noninvasive LCIS.  4 sampled lymph nodes were all negative for metastatic disease.  Given the size of her tumor no Oncotype DX testing was performed.  She is seen to discuss adjuvant radiotherapy. ? ? ? ?PREVIOUS RADIATION THERAPY: No ? ? ?PAST MEDICAL HISTORY:  ?Past Medical History:  ?Diagnosis Date  ? Arthritis   ? Bilateral occipital neuralgia   ? Hypertension   ?   ? ? ?PAST SURGICAL HISTORY: ?Past Surgical History:  ?Procedure Laterality Date  ? BREAST LUMPECTOMY WITH RADIOACTIVE SEED AND SENTINEL LYMPH NODE BIOPSY Left 06/30/2021  ? Procedure: LEFT BREAST LUMPECTOMY WITH RADIOACTIVE SEED AND SENTINEL LYMPH NODE  BIOPSY;  Surgeon: Christina Luna, Sanchez;  Location: [facility];  Service: General;  Laterality: Left;  ? fistula track removal    ? rectal  ? FOOT SURGERY    ? PARTIAL HYSTERECTOMY    ? ? ? ?FAMILY HISTORY:  ?Family History  ?Problem Relation Age of Onset  ? Diabetes Brother   ? Migraines Neg Hx   ? Headache Neg Hx   ? Breast cancer Neg Hx   ? ? ? ?SOCIAL HISTORY:  reports that she has been smoking cigarettes. She has a 25.00 pack-year smoking history. She has never used smokeless tobacco. She reports current alcohol use of about 15.0 standard drinks per week. She reports that she does not use drugs. The patient is single and lives in [place]. She is retired\. She enjoys watching news programs and spending time with family.  ?  ? ?ALLERGIES: Aspirin, Latex, and Lexapro [escitalopram] ? ? ?MEDICATIONS:  ?Current Outpatient Medications  ?Medication Sig Dispense Refill  ? albuterol (VENTOLIN HFA) 108 (90 Base) MCG/ACT inhaler Inhale 2 puffs into the lungs as needed.    ? amLODipine-benazepril (LOTREL) 5-10 MG capsule Take 1 capsule by mouth daily.    ? atorvastatin (LIPITOR) 10 MG tablet Take 10 mg by mouth every morning.    ? Cyanocobalamin (B-12) 1000 MCG CAPS Take 1 capsule by mouth daily.    ? mirtazapine (REMERON) 15 MG tablet Take 15 mg by mouth at bedtime.    ? oxyCODONE (OXY IR/ROXICODONE)  5 MG immediate release tablet Take 1 tablet (5 mg total) by mouth every 6 (six) hours as needed for severe pain. 15 tablet 0  ? zolpidem (AMBIEN) 10 MG tablet Take 10 mg by mouth at bedtime.    ? ?No current facility-administered medications for this visit.  ? ? ? ?REVIEW OF SYSTEMS: On review of systems, the patient reports that she is doing well.  She has had some fullness and drainage from her left breast incision that started on Tuesday.  She reports it is tender and firm.  She denies any fevers or chills or redness of the area.  No other complaints are verbalized. ? ?  ? ?PHYSICAL EXAM:  ?Wt Readings from  Last 3 Encounters:  ?06/30/21 132 lb 7.9 oz (60.1 kg)  ?06/10/21 126 lb 14.4 oz (57.6 kg)  ?05/09/19 142 lb (64.4 kg)  ? ?Temp Readings from Last 3 Encounters:  ?06/30/21 98.1 ?F (36.7 ?C)  ?06/10/21 97.9 ?F (36.6 ?C) (Temporal)  ?05/09/19 (!) 97.2 ?F (36.2 ?C)  ? ?BP Readings from Last 3 Encounters:  ?06/30/21 (!) 160/96  ?06/10/21 138/77  ?05/09/19 138/86  ? ?Pulse Readings from Last 3 Encounters:  ?06/30/21 73  ?06/10/21 85  ?05/09/19 76  ? ? ?In general this is a well appearing African American female in no acute distress. She's alert and oriented x4 and appropriate throughout the examination. Cardiopulmonary assessment is negative for acute distress and she exhibits normal effort.  Her left breast is intact with a well-healed incision without evidence of erythema separation or drainage is noted at either site.  She does have firmness and fullness of the breast itself it feels similar to hematoma.  No drainage is actively noted.  Bandages changed and replaced and dried blood is noted on the previous dressing. ? ? ? ?ECOG = 0 ? ?0 - Asymptomatic (Fully active, able to carry on all predisease activities without restriction) ? ?1 - Symptomatic but completely ambulatory (Restricted in physically strenuous activity but ambulatory and able to carry out work of a light or sedentary nature. For example, light housework, office work) ? ?2 - Symptomatic, <50% in bed during the day (Ambulatory and capable of all self care but unable to carry out any work activities. Up and about more than 50% of waking hours) ? ?3 - Symptomatic, >50% in bed, but not bedbound (Capable of only limited self-care, confined to bed or chair 50% or more of waking hours) ? ?4 - Bedbound (Completely disabled. Cannot carry on any self-care. Totally confined to bed or chair) ? ?5 - Death ? ? Christina Sanchez, Christina Sanchez, Christina Sanchez, et al. [phone]). "Toxicity and response criteria of the [facility]". [place] Oncol. 5 (6):  649-55 ? ? ? ?LABORATORY DATA:  ?Lab Results  ?Component Value Date  ? WBC 6.7 06/10/2021  ? HGB 13.3 06/10/2021  ? HCT 39.8 06/10/2021  ? MCV 103.6 (H) 06/10/2021  ? PLT 381 06/10/2021  ? ?Lab Results  ?Component Value Date  ? NA 139 06/10/2021  ? K 4.3 06/10/2021  ? CL 104 06/10/2021  ? CO2 27 06/10/2021  ? ?Lab Results  ?Component Value Date  ? ALT 9 06/10/2021  ? AST 14 (L) 06/10/2021  ? ALKPHOS 120 06/10/2021  ? BILITOT 0.5 06/10/2021  ? ?  ? ?RADIOGRAPHY: Sanchez Breast Surgical Specimen ? ?Result Date: 06/30/2021 ?CLINICAL DATA:  Evaluate surgical specimen following lumpectomy for LEFT breast cancer. EXAM: SPECIMEN RADIOGRAPH OF THE LEFT BREAST COMPARISON:  Previous exam(s). FINDINGS: Status post excision of the LEFT breast. The radioactive seed and RIBBON biopsy marker clip are present and completely intact. IMPRESSION: Specimen radiograph of the LEFT breast. Electronically Signed   By: Margarette Canada M.D.   On: 06/30/2021 14:08 ? ?Sanchez LT RADIOACTIVE SEED LOC MAMMO GUIDE ? ?Result Date: 06/29/2021 ?CLINICAL DATA:  71 year old female for radioactive seed localization of LEFT breast cancer prior to lumpectomy. EXAM: MAMMOGRAPHIC GUIDED RADIOACTIVE SEED LOCALIZATION OF THE LEFT BREAST COMPARISON:  Previous exam(s). FINDINGS: Patient presents for radioactive seed localization prior to LEFT lumpectomy. I met with the patient and we discussed the procedure of seed localization including benefits and alternatives. We discussed the high likelihood of a successful procedure. We discussed the risks of the procedure including infection, bleeding, tissue injury and further surgery. We discussed the low dose of radioactivity involved in the procedure. Informed, written consent was given. The usual time-out protocol was performed immediately prior to the procedure. Using mammographic guidance, sterile technique, 1% lidocaine and an I-125 radioactive seed, the RIBBON clip was localized using a LATERAL approach. The follow-up  mammogram images confirm the seed in the expected location and were marked for Dr. Brantley Stage. Follow-up survey of the patient confirms presence of the radioactive seed. Order number of I-125 seed:  [id number]. Total activity

## 2021-07-28 NOTE — Progress Notes (Addendum)
?Radiation Oncology         [phone] ?________________________________ ? ?Name: Christina Sanchez        MRN: [id number]  ?Date of Service: 07/30/2021 DOB: 24-Apr-1950 ? ?EO:[id number], Himanshu, MD  Truitt Merle, MD    ? ?REFERRING PHYSICIAN: Truitt Merle, MD ? ? ?DIAGNOSIS: The encounter diagnosis was Malignant neoplasm of upper-outer quadrant of left breast in female, estrogen receptor positive ([place]). ? ? ?HISTORY OF PRESENT ILLNESS: Christina Sanchez is a 71 y.o. female originally seen in the multidisciplinary breast clinic for a new diagnosis of left breast cancer. The patient was noted to have screening detected mass in the left breast.  Diagnostic imaging by ultrasound measured a mass in the 10 o'clock position up to 8 mm in greatest dimension with no evidence of left axillary adenopathy.  She underwent biopsy of this on 06/04/2021 which revealed grade 1 invasive mammary carcinoma with focal ductal and lobular carcinoma in situ, overall her phenotype is felt to be consistent with Lobular carcinoma and LCIS.  Her cancer was ER/PR positive, HER2 negative and Christina-67 was 2%.   ? ?Since her last visit, she underwent a left breast lumpectomy on 06/30/2021 that showed a grade 2 invasive lobular carcinoma measuring 8 mm in greatest dimension.  Margins were negative but 1 mm to the lateral margin and 3 mm for noninvasive LCIS.  4 sampled lymph nodes were all negative for metastatic disease.  Given the size of her tumor no Oncotype DX testing was performed.  She is seen to discuss adjuvant radiotherapy. ? ? ? ?PREVIOUS RADIATION THERAPY: No ? ? ?PAST MEDICAL HISTORY:  ?Past Medical History:  ?Diagnosis Date  ? Arthritis   ? Bilateral occipital neuralgia   ? Hypertension   ?   ? ? ?PAST SURGICAL HISTORY: ?Past Surgical History:  ?Procedure Laterality Date  ? BREAST LUMPECTOMY WITH RADIOACTIVE SEED AND SENTINEL LYMPH NODE BIOPSY Left 06/30/2021  ? Procedure: LEFT BREAST LUMPECTOMY WITH RADIOACTIVE SEED AND SENTINEL LYMPH NODE  BIOPSY;  Surgeon: Erroll Luna, MD;  Location: [facility];  Service: General;  Laterality: Left;  ? fistula track removal    ? rectal  ? FOOT SURGERY    ? PARTIAL HYSTERECTOMY    ? ? ? ?FAMILY HISTORY:  ?Family History  ?Problem Relation Age of Onset  ? Diabetes Brother   ? Migraines Neg Hx   ? Headache Neg Hx   ? Breast cancer Neg Hx   ? ? ? ?SOCIAL HISTORY:  reports that she has been smoking cigarettes. She has a 25.00 pack-year smoking history. She has never used smokeless tobacco. She reports current alcohol use of about 15.0 standard drinks per week. She reports that she does not use drugs. The patient is single and lives in [place]. She is retired\. She enjoys watching news programs and spending time with family.  ?  ? ?ALLERGIES: Aspirin, Latex, and Lexapro [escitalopram] ? ? ?MEDICATIONS:  ?Current Outpatient Medications  ?Medication Sig Dispense Refill  ? albuterol (VENTOLIN HFA) 108 (90 Base) MCG/ACT inhaler Inhale 2 puffs into the lungs as needed.    ? amLODipine-benazepril (LOTREL) 5-10 MG capsule Take 1 capsule by mouth daily.    ? atorvastatin (LIPITOR) 10 MG tablet Take 10 mg by mouth every morning.    ? Cyanocobalamin (B-12) 1000 MCG CAPS Take 1 capsule by mouth daily.    ? mirtazapine (REMERON) 15 MG tablet Take 15 mg by mouth at bedtime.    ? oxyCODONE (OXY IR/ROXICODONE)  5 MG immediate release tablet Take 1 tablet (5 mg total) by mouth every 6 (six) hours as needed for severe pain. 15 tablet 0  ? zolpidem (AMBIEN) 10 MG tablet Take 10 mg by mouth at bedtime.    ? ?No current facility-administered medications for this visit.  ? ? ? ?REVIEW OF SYSTEMS: On review of systems, the patient reports that she is doing well.  She has had some fullness and drainage from her left breast incision that started on Tuesday.  She reports it is tender and firm.  She denies any fevers or chills or redness of the area.  No other complaints are verbalized. ? ?  ? ?PHYSICAL EXAM:  ?Wt Readings from  Last 3 Encounters:  ?06/30/21 132 lb 7.9 oz (60.1 kg)  ?06/10/21 126 lb 14.4 oz (57.6 kg)  ?05/09/19 142 lb (64.4 kg)  ? ?Temp Readings from Last 3 Encounters:  ?06/30/21 98.1 ?F (36.7 ?C)  ?06/10/21 97.9 ?F (36.6 ?C) (Temporal)  ?05/09/19 (!) 97.2 ?F (36.2 ?C)  ? ?BP Readings from Last 3 Encounters:  ?06/30/21 (!) 160/96  ?06/10/21 138/77  ?05/09/19 138/86  ? ?Pulse Readings from Last 3 Encounters:  ?06/30/21 73  ?06/10/21 85  ?05/09/19 76  ? ? ?In general this is a well appearing African American female in no acute distress. She's alert and oriented x4 and appropriate throughout the examination. Cardiopulmonary assessment is negative for acute distress and she exhibits normal effort.  Her left breast is intact with a well-healed incision without evidence of erythema separation or drainage is noted at either site.  She does have firmness and fullness of the breast itself it feels similar to hematoma.  No drainage is actively noted.  Bandages changed and replaced and dried blood is noted on the previous dressing. ? ? ? ?ECOG = 0 ? ?0 - Asymptomatic (Fully active, able to carry on all predisease activities without restriction) ? ?1 - Symptomatic but completely ambulatory (Restricted in physically strenuous activity but ambulatory and able to carry out work of a light or sedentary nature. For example, light housework, office work) ? ?2 - Symptomatic, <50% in bed during the day (Ambulatory and capable of all self care but unable to carry out any work activities. Up and about more than 50% of waking hours) ? ?3 - Symptomatic, >50% in bed, but not bedbound (Capable of only limited self-care, confined to bed or chair 50% or more of waking hours) ? ?4 - Bedbound (Completely disabled. Cannot carry on any self-care. Totally confined to bed or chair) ? ?5 - Death ? ? Oken MM, Creech RH, Tormey DC, et al. [phone]). "Toxicity and response criteria of the [facility]". [place] Oncol. 5 (6):  649-55 ? ? ? ?LABORATORY DATA:  ?Lab Results  ?Component Value Date  ? WBC 6.7 06/10/2021  ? HGB 13.3 06/10/2021  ? HCT 39.8 06/10/2021  ? MCV 103.6 (H) 06/10/2021  ? PLT 381 06/10/2021  ? ?Lab Results  ?Component Value Date  ? NA 139 06/10/2021  ? K 4.3 06/10/2021  ? CL 104 06/10/2021  ? CO2 27 06/10/2021  ? ?Lab Results  ?Component Value Date  ? ALT 9 06/10/2021  ? AST 14 (L) 06/10/2021  ? ALKPHOS 120 06/10/2021  ? BILITOT 0.5 06/10/2021  ? ?  ? ?RADIOGRAPHY: MM Breast Surgical Specimen ? ?Result Date: 06/30/2021 ?CLINICAL DATA:  Evaluate surgical specimen following lumpectomy for LEFT breast cancer. EXAM: SPECIMEN RADIOGRAPH OF THE LEFT BREAST COMPARISON:  Previous exam(s). FINDINGS: Status post excision of the LEFT breast. The radioactive seed and RIBBON biopsy marker clip are present and completely intact. IMPRESSION: Specimen radiograph of the LEFT breast. Electronically Signed   By: Margarette Canada M.D.   On: 06/30/2021 14:08 ? ?MM LT RADIOACTIVE SEED LOC MAMMO GUIDE ? ?Result Date: 06/29/2021 ?CLINICAL DATA:  71 year old female for radioactive seed localization of LEFT breast cancer prior to lumpectomy. EXAM: MAMMOGRAPHIC GUIDED RADIOACTIVE SEED LOCALIZATION OF THE LEFT BREAST COMPARISON:  Previous exam(s). FINDINGS: Patient presents for radioactive seed localization prior to LEFT lumpectomy. I met with the patient and we discussed the procedure of seed localization including benefits and alternatives. We discussed the high likelihood of a successful procedure. We discussed the risks of the procedure including infection, bleeding, tissue injury and further surgery. We discussed the low dose of radioactivity involved in the procedure. Informed, written consent was given. The usual time-out protocol was performed immediately prior to the procedure. Using mammographic guidance, sterile technique, 1% lidocaine and an I-125 radioactive seed, the RIBBON clip was localized using a LATERAL approach. The follow-up  mammogram images confirm the seed in the expected location and were marked for Dr. Brantley Stage. Follow-up survey of the patient confirms presence of the radioactive seed. Order number of I-125 seed:  [id number]. Total activity

## 2021-07-29 ENCOUNTER — Telehealth: Payer: Self-pay

## 2021-07-29 NOTE — Telephone Encounter (Signed)
Date/time error correction. ? ?Appointment reminder. I spoke w/ patient, verified her identity and reminded patient of her 7:30am-07/30/21 in-person appointment w/ Shona Simpson PA-C. I advised patient to arrive 73mn early for check-in. I left my extension [phone] case patient needs anything. Patient verbalized understanding of information. ?

## 2021-07-29 NOTE — Telephone Encounter (Signed)
Appointment reminder. I spoke w/ patient, verified her identity and reminded patient of her 8:00am-07/29/21 in-person appointment w/ Shona Simpson PA-C. I advised patient to arrive 41mn early for check-in. I left my extension [phone] case patient needs anything. Patient verbalized understanding of information. ?

## 2021-07-30 ENCOUNTER — Encounter: Payer: Self-pay | Admitting: Radiation Oncology

## 2021-07-30 ENCOUNTER — Other Ambulatory Visit: Payer: Self-pay

## 2021-07-30 ENCOUNTER — Encounter: Payer: Self-pay | Admitting: Licensed Clinical Social Worker

## 2021-07-30 ENCOUNTER — Ambulatory Visit
Admission: RE | Admit: 2021-07-30 | Discharge: 2021-07-30 | Disposition: A | Discharge status: Home / Self Care | Payer: Medicare Other | Source: Ambulatory Visit | Attending: Radiation Oncology | Admitting: Radiation Oncology

## 2021-07-30 ENCOUNTER — Encounter: Payer: Self-pay | Admitting: *Deleted

## 2021-07-30 ENCOUNTER — Inpatient Hospital Stay: Payer: Medicare Other | Attending: Hematology | Admitting: Licensed Clinical Social Worker

## 2021-07-30 VITALS — BP 124/61 | HR 92 | Temp 98.6°F | Resp 19 | Ht 66.0 in | Wt 134.0 lb

## 2021-07-30 DIAGNOSIS — I10 Essential (primary) hypertension: Secondary | ICD-10-CM | POA: Diagnosis not present

## 2021-07-30 DIAGNOSIS — C50412 Malignant neoplasm of upper-outer quadrant of left female breast: Secondary | ICD-10-CM | POA: Diagnosis present

## 2021-07-30 DIAGNOSIS — M129 Arthropathy, unspecified: Secondary | ICD-10-CM | POA: Diagnosis not present

## 2021-07-30 DIAGNOSIS — F17210 Nicotine dependence, cigarettes, uncomplicated: Secondary | ICD-10-CM | POA: Diagnosis not present

## 2021-07-30 DIAGNOSIS — Z170 Estrogen receptor positive status [ER+]: Secondary | ICD-10-CM

## 2021-07-30 DIAGNOSIS — Z79899 Other long term (current) drug therapy: Secondary | ICD-10-CM | POA: Insufficient documentation

## 2021-07-30 MED ORDER — TRAMADOL HCL 50 MG PO TABS: 50.0000 mg | ORAL_TABLET | Freq: Four times a day (QID) | ORAL | 0 refills | Status: DC | PRN

## 2021-07-30 NOTE — Progress Notes (Signed)
[facility] CSW Progress Note ? ?Clinical Social Worker met with patient to discuss financial concerns.  CSW completed [employer] in [place] with patient and submitted on her behalf.  Pt also given $50.00 gift card from the [facility].  Pt is s/p lumpectomy and will be starting radiation treatment the first week of May.  Pt's son resides w/ her; however, pt does not receive any financial support from him.  Pt is solely reliant on social security income and has struggled with the co-pays associated with treatment.  Pt provided with a food bag from the pantry today.  Pt is connected to a mentor for emotional support.  CSW to remain available to provide assistance as appropriate. ? ? ? ?[place] Work, LCSW ?

## 2021-07-30 NOTE — Progress Notes (Signed)
Consult appointment. I verified patient identity and began nursing interview. Patient reports LT breast discomfort 7/10, w/ a lesion located on the LT side of her breast that has some bloody discharge.  ? ?Meaningful use complete. ?Hysterectomy- NO chances of pregnancy. ? ?BP 124/61 (BP Location: Right Arm, Patient Position: Sitting, Cuff Size: Normal)   Pulse 92   Temp 98.6 ?F (37 ?C) (Oral)   Resp 19   Ht '5\' 6"'$  (1.676 m)   Wt 134 lb (60.8 kg)   SpO2 99%   BMI 21.63 kg/m?  ? ?

## 2021-07-31 ENCOUNTER — Ambulatory Visit: Payer: Medicare Other | Admitting: Radiation Oncology

## 2021-08-03 ENCOUNTER — Encounter (HOSPITAL_COMMUNITY): Payer: Self-pay

## 2021-08-03 ENCOUNTER — Ambulatory Visit (HOSPITAL_BASED_OUTPATIENT_CLINIC_OR_DEPARTMENT_OTHER)
Admission: RE | Admit: 2021-08-03 | Discharge: 2021-08-03 | Disposition: A | Discharge status: Home / Self Care | Payer: Medicare Other | Source: Ambulatory Visit | Attending: Family Medicine | Admitting: Family Medicine

## 2021-08-03 ENCOUNTER — Ambulatory Visit (HOSPITAL_COMMUNITY)
Admission: RE | Admit: 2021-08-03 | Discharge: 2021-08-03 | Disposition: A | Discharge status: Home / Self Care | Payer: Medicare Other | Source: Ambulatory Visit | Attending: Family Medicine | Admitting: Family Medicine

## 2021-08-03 DIAGNOSIS — R0609 Other forms of dyspnea: Secondary | ICD-10-CM | POA: Insufficient documentation

## 2021-08-03 DIAGNOSIS — I10 Essential (primary) hypertension: Secondary | ICD-10-CM | POA: Insufficient documentation

## 2021-08-03 DIAGNOSIS — R6889 Other general symptoms and signs: Secondary | ICD-10-CM | POA: Diagnosis not present

## 2021-08-03 DIAGNOSIS — I081 Rheumatic disorders of both mitral and tricuspid valves: Secondary | ICD-10-CM | POA: Insufficient documentation

## 2021-08-03 HISTORY — PX: TRANSTHORACIC ECHOCARDIOGRAM: SHX275

## 2021-08-03 LAB — ECHOCARDIOGRAM COMPLETE
Area-P 1/2: 3.56 cm2
MV M vel: 6.08 m/s
MV Peak grad: 147.6 mmHg
Radius: 0.5 cm
S' Lateral: 3.2 cm

## 2021-08-03 NOTE — Progress Notes (Signed)
ABI has been completed.  ? ?Preliminary results in CV Proc.  ? ?Bernetha Anschutz Carri Spillers ?08/03/2021 9:37 AM    ?

## 2021-08-04 ENCOUNTER — Ambulatory Visit: Payer: Medicare Other | Admitting: Radiation Oncology

## 2021-08-12 ENCOUNTER — Ambulatory Visit: Payer: Medicare Other | Admitting: Radiation Oncology

## 2021-08-13 ENCOUNTER — Encounter: Payer: Self-pay | Admitting: *Deleted

## 2021-08-18 ENCOUNTER — Telehealth: Payer: Self-pay | Admitting: Radiation Oncology

## 2021-08-18 NOTE — Telephone Encounter (Signed)
The patient called the office stating she didn't know what to do about her breast. It continues to drain blood and had become more red with white appearing mucous/drainage. She reports it is very painful and has worsened since we saw her. We discussed the importance of being seen by her surgical team and I will reach out to their PA to see if she can be seen tomorrow in the clinic. I'm happy to see her too, but would prefer we see if they would like to see her first.  ?

## 2021-08-26 ENCOUNTER — Ambulatory Visit: Payer: Medicare Other | Admitting: Radiation Oncology

## 2021-08-28 ENCOUNTER — Encounter: Payer: Self-pay | Admitting: Licensed Clinical Social Worker

## 2021-08-28 DIAGNOSIS — C50412 Malignant neoplasm of upper-outer quadrant of left female breast: Secondary | ICD-10-CM

## 2021-08-28 NOTE — Progress Notes (Signed)
[facility] CSW Progress Note  Holiday representative  received a call from [employer], Rip Harbour   who informed CSW pt had expressed concerns regarding the cost of the medical bills associated w/ radiation.  Rip Harbour requested CSW reach out to pt regarding financial assistance.  CSW contacted pt by phone.  In April CSW met w/ pt and competed/submitted a Retail buyer.  CSW reiterated previous conversation that pt is anticipated to be approved for $500 Farrel Demark and is also able to apply for an Lita Mains once radiation treatment begins to assist w/ medical bills.  Pt confirmed she will be in for her appointments next week to schedule radiation.  CSW to remain available to address any additional needs which may arise throughout duration of treatment.    Henriette Combs, LCSW

## 2021-09-01 ENCOUNTER — Encounter: Payer: Self-pay | Admitting: *Deleted

## 2021-09-02 ENCOUNTER — Ambulatory Visit: Payer: Medicare Other | Admitting: Radiation Oncology

## 2021-09-02 ENCOUNTER — Other Ambulatory Visit: Payer: Self-pay

## 2021-09-02 ENCOUNTER — Ambulatory Visit
Admission: RE | Admit: 2021-09-02 | Discharge: 2021-09-02 | Disposition: A | Discharge status: Home / Self Care | Payer: Medicare Other | Source: Ambulatory Visit | Attending: Radiation Oncology | Admitting: Radiation Oncology

## 2021-09-02 DIAGNOSIS — Z170 Estrogen receptor positive status [ER+]: Secondary | ICD-10-CM | POA: Diagnosis not present

## 2021-09-02 DIAGNOSIS — C50412 Malignant neoplasm of upper-outer quadrant of left female breast: Secondary | ICD-10-CM | POA: Diagnosis not present

## 2021-09-03 HISTORY — PX: OTHER SURGICAL HISTORY: SHX169

## 2021-09-09 ENCOUNTER — Encounter: Payer: Self-pay | Admitting: *Deleted

## 2021-09-09 DIAGNOSIS — C50412 Malignant neoplasm of upper-outer quadrant of left female breast: Secondary | ICD-10-CM | POA: Diagnosis not present

## 2021-09-15 ENCOUNTER — Ambulatory Visit: Payer: Medicare Other | Admitting: Radiation Oncology

## 2021-09-16 ENCOUNTER — Ambulatory Visit: Payer: Medicare Other

## 2021-09-17 ENCOUNTER — Ambulatory Visit: Payer: Medicare Other

## 2021-09-18 ENCOUNTER — Ambulatory Visit: Payer: Medicare Other

## 2021-09-21 ENCOUNTER — Ambulatory Visit: Payer: Medicare Other

## 2021-09-21 ENCOUNTER — Telehealth: Payer: Self-pay | Admitting: Hematology

## 2021-09-21 NOTE — Telephone Encounter (Signed)
.  Called patient to schedule appointment per 6/9 inbasket, patient is aware of date and time.   

## 2021-09-22 ENCOUNTER — Ambulatory Visit: Payer: Medicare Other

## 2021-09-23 ENCOUNTER — Ambulatory Visit: Payer: Medicare Other

## 2021-09-24 ENCOUNTER — Ambulatory Visit: Payer: Medicare Other

## 2021-09-25 ENCOUNTER — Ambulatory Visit: Payer: Medicare Other

## 2021-09-28 ENCOUNTER — Other Ambulatory Visit: Payer: Self-pay

## 2021-09-28 ENCOUNTER — Ambulatory Visit
Admission: RE | Admit: 2021-09-28 | Discharge: 2021-09-28 | Disposition: A | Discharge status: Home / Self Care | Payer: Medicare Other | Source: Ambulatory Visit | Attending: Radiation Oncology | Admitting: Radiation Oncology

## 2021-09-28 DIAGNOSIS — C50412 Malignant neoplasm of upper-outer quadrant of left female breast: Secondary | ICD-10-CM | POA: Insufficient documentation

## 2021-09-28 DIAGNOSIS — Z170 Estrogen receptor positive status [ER+]: Secondary | ICD-10-CM | POA: Insufficient documentation

## 2021-09-28 LAB — RAD ONC ARIA SESSION SUMMARY
Course Elapsed Days: 0
Plan Fractions Treated to Date: 1
Plan Prescribed Dose Per Fraction: 2.66 Gy
Plan Total Fractions Prescribed: 16
Plan Total Prescribed Dose: 42.56 Gy
Reference Point Dosage Given to Date: 2.66 Gy
Reference Point Session Dosage Given: 2.66 Gy
Session Number: 1

## 2021-09-29 ENCOUNTER — Other Ambulatory Visit: Payer: Self-pay

## 2021-09-29 ENCOUNTER — Ambulatory Visit
Admission: RE | Admit: 2021-09-29 | Discharge: 2021-09-29 | Disposition: A | Discharge status: Home / Self Care | Payer: Medicare Other | Source: Ambulatory Visit | Attending: Radiation Oncology | Admitting: Radiation Oncology

## 2021-09-29 DIAGNOSIS — C50412 Malignant neoplasm of upper-outer quadrant of left female breast: Secondary | ICD-10-CM | POA: Diagnosis not present

## 2021-09-29 LAB — RAD ONC ARIA SESSION SUMMARY
Course Elapsed Days: 1
Plan Fractions Treated to Date: 2
Plan Prescribed Dose Per Fraction: 2.66 Gy
Plan Total Fractions Prescribed: 16
Plan Total Prescribed Dose: 42.56 Gy
Reference Point Dosage Given to Date: 5.32 Gy
Reference Point Session Dosage Given: 2.66 Gy
Session Number: 2

## 2021-09-30 ENCOUNTER — Other Ambulatory Visit: Payer: Self-pay

## 2021-09-30 ENCOUNTER — Ambulatory Visit
Admission: RE | Admit: 2021-09-30 | Discharge: 2021-09-30 | Disposition: A | Discharge status: Home / Self Care | Payer: Medicare Other | Source: Ambulatory Visit | Attending: Radiation Oncology | Admitting: Radiation Oncology

## 2021-09-30 DIAGNOSIS — C50412 Malignant neoplasm of upper-outer quadrant of left female breast: Secondary | ICD-10-CM | POA: Diagnosis not present

## 2021-09-30 LAB — RAD ONC ARIA SESSION SUMMARY
Course Elapsed Days: 2
Plan Fractions Treated to Date: 3
Plan Prescribed Dose Per Fraction: 2.66 Gy
Plan Total Fractions Prescribed: 16
Plan Total Prescribed Dose: 42.56 Gy
Reference Point Dosage Given to Date: 7.98 Gy
Reference Point Session Dosage Given: 2.66 Gy
Session Number: 3

## 2021-10-01 ENCOUNTER — Ambulatory Visit: Payer: Medicare Other

## 2021-10-02 ENCOUNTER — Ambulatory Visit
Admission: RE | Admit: 2021-10-02 | Discharge: 2021-10-02 | Disposition: A | Discharge status: Home / Self Care | Payer: Medicare Other | Source: Ambulatory Visit | Attending: Radiation Oncology | Admitting: Radiation Oncology

## 2021-10-02 ENCOUNTER — Other Ambulatory Visit: Payer: Self-pay

## 2021-10-02 DIAGNOSIS — C50412 Malignant neoplasm of upper-outer quadrant of left female breast: Secondary | ICD-10-CM | POA: Diagnosis not present

## 2021-10-02 DIAGNOSIS — Z170 Estrogen receptor positive status [ER+]: Secondary | ICD-10-CM

## 2021-10-02 LAB — RAD ONC ARIA SESSION SUMMARY
Course Elapsed Days: 4
Plan Fractions Treated to Date: 4
Plan Prescribed Dose Per Fraction: 2.66 Gy
Plan Total Fractions Prescribed: 16
Plan Total Prescribed Dose: 42.56 Gy
Reference Point Dosage Given to Date: 10.64 Gy
Reference Point Session Dosage Given: 2.66 Gy
Session Number: 4

## 2021-10-02 MED ORDER — ALRA NON-METALLIC DEODORANT (RAD-ONC)
1.0000 | Freq: Once | TOPICAL | Status: AC
  Administered 2021-10-02: 1 via TOPICAL

## 2021-10-02 MED ORDER — RADIAPLEXRX EX GEL: Freq: Once | CUTANEOUS | Status: AC

## 2021-10-05 ENCOUNTER — Ambulatory Visit: Payer: Medicare Other

## 2021-10-05 ENCOUNTER — Other Ambulatory Visit: Payer: Self-pay

## 2021-10-05 ENCOUNTER — Ambulatory Visit
Admission: RE | Admit: 2021-10-05 | Discharge: 2021-10-05 | Disposition: A | Discharge status: Home / Self Care | Payer: Medicare Other | Source: Ambulatory Visit | Attending: Radiation Oncology | Admitting: Radiation Oncology

## 2021-10-05 DIAGNOSIS — C50412 Malignant neoplasm of upper-outer quadrant of left female breast: Secondary | ICD-10-CM | POA: Diagnosis not present

## 2021-10-05 LAB — RAD ONC ARIA SESSION SUMMARY
Course Elapsed Days: 7
Plan Fractions Treated to Date: 5
Plan Prescribed Dose Per Fraction: 2.66 Gy
Plan Total Fractions Prescribed: 16
Plan Total Prescribed Dose: 42.56 Gy
Reference Point Dosage Given to Date: 13.3 Gy
Reference Point Session Dosage Given: 2.66 Gy
Session Number: 5

## 2021-10-06 ENCOUNTER — Other Ambulatory Visit: Payer: Self-pay

## 2021-10-06 ENCOUNTER — Ambulatory Visit
Admission: RE | Admit: 2021-10-06 | Discharge: 2021-10-06 | Disposition: A | Discharge status: Home / Self Care | Payer: Medicare Other | Source: Ambulatory Visit | Attending: Radiation Oncology | Admitting: Radiation Oncology

## 2021-10-06 DIAGNOSIS — C50412 Malignant neoplasm of upper-outer quadrant of left female breast: Secondary | ICD-10-CM | POA: Diagnosis not present

## 2021-10-06 LAB — RAD ONC ARIA SESSION SUMMARY
Course Elapsed Days: 8
Plan Fractions Treated to Date: 6
Plan Prescribed Dose Per Fraction: 2.66 Gy
Plan Total Fractions Prescribed: 16
Plan Total Prescribed Dose: 42.56 Gy
Reference Point Dosage Given to Date: 15.96 Gy
Reference Point Session Dosage Given: 2.66 Gy
Session Number: 6

## 2021-10-07 ENCOUNTER — Other Ambulatory Visit: Payer: Self-pay

## 2021-10-07 ENCOUNTER — Ambulatory Visit: Payer: Medicare Other

## 2021-10-07 ENCOUNTER — Ambulatory Visit
Admission: RE | Admit: 2021-10-07 | Discharge: 2021-10-07 | Disposition: A | Discharge status: Home / Self Care | Payer: Medicare Other | Source: Ambulatory Visit | Attending: Radiation Oncology | Admitting: Radiation Oncology

## 2021-10-07 DIAGNOSIS — C50412 Malignant neoplasm of upper-outer quadrant of left female breast: Secondary | ICD-10-CM | POA: Diagnosis not present

## 2021-10-07 LAB — RAD ONC ARIA SESSION SUMMARY
Course Elapsed Days: 9
Plan Fractions Treated to Date: 7
Plan Prescribed Dose Per Fraction: 2.66 Gy
Plan Total Fractions Prescribed: 16
Plan Total Prescribed Dose: 42.56 Gy
Reference Point Dosage Given to Date: 18.62 Gy
Reference Point Session Dosage Given: 2.66 Gy
Session Number: 7

## 2021-10-08 ENCOUNTER — Ambulatory Visit: Payer: Medicare Other

## 2021-10-08 ENCOUNTER — Other Ambulatory Visit: Payer: Self-pay

## 2021-10-08 ENCOUNTER — Ambulatory Visit
Admission: RE | Admit: 2021-10-08 | Discharge: 2021-10-08 | Disposition: A | Discharge status: Home / Self Care | Payer: Medicare Other | Source: Ambulatory Visit | Attending: Radiation Oncology | Admitting: Radiation Oncology

## 2021-10-08 DIAGNOSIS — C50412 Malignant neoplasm of upper-outer quadrant of left female breast: Secondary | ICD-10-CM | POA: Diagnosis not present

## 2021-10-08 LAB — RAD ONC ARIA SESSION SUMMARY
Course Elapsed Days: 10
Plan Fractions Treated to Date: 8
Plan Prescribed Dose Per Fraction: 2.66 Gy
Plan Total Fractions Prescribed: 16
Plan Total Prescribed Dose: 42.56 Gy
Reference Point Dosage Given to Date: 21.28 Gy
Reference Point Session Dosage Given: 2.66 Gy
Session Number: 8

## 2021-10-09 ENCOUNTER — Ambulatory Visit: Payer: Medicare Other

## 2021-10-09 ENCOUNTER — Ambulatory Visit
Admission: RE | Admit: 2021-10-09 | Discharge: 2021-10-09 | Disposition: A | Discharge status: Home / Self Care | Payer: Medicare Other | Source: Ambulatory Visit | Attending: Radiation Oncology | Admitting: Radiation Oncology

## 2021-10-09 ENCOUNTER — Other Ambulatory Visit: Payer: Self-pay

## 2021-10-09 DIAGNOSIS — C50412 Malignant neoplasm of upper-outer quadrant of left female breast: Secondary | ICD-10-CM | POA: Diagnosis not present

## 2021-10-09 LAB — RAD ONC ARIA SESSION SUMMARY
Course Elapsed Days: 11
Plan Fractions Treated to Date: 9
Plan Prescribed Dose Per Fraction: 2.66 Gy
Plan Total Fractions Prescribed: 16
Plan Total Prescribed Dose: 42.56 Gy
Reference Point Dosage Given to Date: 23.94 Gy
Reference Point Session Dosage Given: 2.66 Gy
Session Number: 9

## 2021-10-12 ENCOUNTER — Ambulatory Visit: Payer: Medicare Other

## 2021-10-12 ENCOUNTER — Other Ambulatory Visit: Payer: Self-pay

## 2021-10-12 ENCOUNTER — Ambulatory Visit
Admission: RE | Admit: 2021-10-12 | Discharge: 2021-10-12 | Disposition: A | Discharge status: Home / Self Care | Payer: Medicare Other | Source: Ambulatory Visit | Attending: Radiation Oncology | Admitting: Radiation Oncology

## 2021-10-12 DIAGNOSIS — Z170 Estrogen receptor positive status [ER+]: Secondary | ICD-10-CM | POA: Insufficient documentation

## 2021-10-12 DIAGNOSIS — C50412 Malignant neoplasm of upper-outer quadrant of left female breast: Secondary | ICD-10-CM | POA: Diagnosis not present

## 2021-10-12 LAB — RAD ONC ARIA SESSION SUMMARY
Course Elapsed Days: 14
Plan Fractions Treated to Date: 10
Plan Prescribed Dose Per Fraction: 2.66 Gy
Plan Total Fractions Prescribed: 16
Plan Total Prescribed Dose: 42.56 Gy
Reference Point Dosage Given to Date: 26.6 Gy
Reference Point Session Dosage Given: 2.66 Gy
Session Number: 10

## 2021-10-14 ENCOUNTER — Other Ambulatory Visit: Payer: Self-pay

## 2021-10-14 ENCOUNTER — Ambulatory Visit
Admission: RE | Admit: 2021-10-14 | Discharge: 2021-10-14 | Disposition: A | Discharge status: Home / Self Care | Payer: Medicare Other | Source: Ambulatory Visit | Attending: Radiation Oncology | Admitting: Radiation Oncology

## 2021-10-14 ENCOUNTER — Ambulatory Visit: Payer: Medicare Other

## 2021-10-14 DIAGNOSIS — C50412 Malignant neoplasm of upper-outer quadrant of left female breast: Secondary | ICD-10-CM | POA: Diagnosis not present

## 2021-10-14 LAB — RAD ONC ARIA SESSION SUMMARY
Course Elapsed Days: 16
Plan Fractions Treated to Date: 11
Plan Prescribed Dose Per Fraction: 2.66 Gy
Plan Total Fractions Prescribed: 16
Plan Total Prescribed Dose: 42.56 Gy
Reference Point Dosage Given to Date: 29.26 Gy
Reference Point Session Dosage Given: 2.66 Gy
Session Number: 11

## 2021-10-15 ENCOUNTER — Other Ambulatory Visit: Payer: Self-pay

## 2021-10-15 ENCOUNTER — Ambulatory Visit
Admission: RE | Admit: 2021-10-15 | Discharge: 2021-10-15 | Disposition: A | Discharge status: Home / Self Care | Payer: Medicare Other | Source: Ambulatory Visit | Attending: Radiation Oncology | Admitting: Radiation Oncology

## 2021-10-15 DIAGNOSIS — C50412 Malignant neoplasm of upper-outer quadrant of left female breast: Secondary | ICD-10-CM | POA: Diagnosis not present

## 2021-10-15 LAB — RAD ONC ARIA SESSION SUMMARY
Course Elapsed Days: 17
Plan Fractions Treated to Date: 12
Plan Prescribed Dose Per Fraction: 2.66 Gy
Plan Total Fractions Prescribed: 16
Plan Total Prescribed Dose: 42.56 Gy
Reference Point Dosage Given to Date: 31.92 Gy
Reference Point Session Dosage Given: 2.66 Gy
Session Number: 12

## 2021-10-16 ENCOUNTER — Other Ambulatory Visit: Payer: Self-pay

## 2021-10-16 ENCOUNTER — Ambulatory Visit
Admission: RE | Admit: 2021-10-16 | Discharge: 2021-10-16 | Disposition: A | Discharge status: Home / Self Care | Payer: Medicare Other | Source: Ambulatory Visit | Attending: Radiation Oncology | Admitting: Radiation Oncology

## 2021-10-16 DIAGNOSIS — C50412 Malignant neoplasm of upper-outer quadrant of left female breast: Secondary | ICD-10-CM | POA: Diagnosis not present

## 2021-10-16 LAB — RAD ONC ARIA SESSION SUMMARY
Course Elapsed Days: 18
Plan Fractions Treated to Date: 13
Plan Prescribed Dose Per Fraction: 2.66 Gy
Plan Total Fractions Prescribed: 16
Plan Total Prescribed Dose: 42.56 Gy
Reference Point Dosage Given to Date: 34.58 Gy
Reference Point Session Dosage Given: 2.66 Gy
Session Number: 13

## 2021-10-19 ENCOUNTER — Other Ambulatory Visit: Payer: Self-pay

## 2021-10-19 ENCOUNTER — Ambulatory Visit
Admission: RE | Admit: 2021-10-19 | Discharge: 2021-10-19 | Disposition: A | Discharge status: Home / Self Care | Payer: Medicare Other | Source: Ambulatory Visit | Attending: Radiation Oncology | Admitting: Radiation Oncology

## 2021-10-19 DIAGNOSIS — C50412 Malignant neoplasm of upper-outer quadrant of left female breast: Secondary | ICD-10-CM | POA: Diagnosis not present

## 2021-10-19 LAB — RAD ONC ARIA SESSION SUMMARY
Course Elapsed Days: 21
Plan Fractions Treated to Date: 14
Plan Prescribed Dose Per Fraction: 2.66 Gy
Plan Total Fractions Prescribed: 16
Plan Total Prescribed Dose: 42.56 Gy
Reference Point Dosage Given to Date: 37.24 Gy
Reference Point Session Dosage Given: 2.66 Gy
Session Number: 14

## 2021-10-20 ENCOUNTER — Ambulatory Visit: Payer: Medicare Other

## 2021-10-21 ENCOUNTER — Other Ambulatory Visit: Payer: Self-pay

## 2021-10-21 ENCOUNTER — Ambulatory Visit
Admission: RE | Admit: 2021-10-21 | Discharge: 2021-10-21 | Disposition: A | Discharge status: Home / Self Care | Payer: Medicare Other | Source: Ambulatory Visit | Attending: Radiation Oncology | Admitting: Radiation Oncology

## 2021-10-21 ENCOUNTER — Ambulatory Visit: Payer: Medicare Other

## 2021-10-21 DIAGNOSIS — C50412 Malignant neoplasm of upper-outer quadrant of left female breast: Secondary | ICD-10-CM | POA: Diagnosis not present

## 2021-10-21 LAB — RAD ONC ARIA SESSION SUMMARY
Course Elapsed Days: 23
Plan Fractions Treated to Date: 15
Plan Prescribed Dose Per Fraction: 2.66 Gy
Plan Total Fractions Prescribed: 16
Plan Total Prescribed Dose: 42.56 Gy
Reference Point Dosage Given to Date: 39.9 Gy
Reference Point Session Dosage Given: 2.66 Gy
Session Number: 15

## 2021-10-22 ENCOUNTER — Other Ambulatory Visit: Payer: Self-pay

## 2021-10-22 ENCOUNTER — Ambulatory Visit
Admission: RE | Admit: 2021-10-22 | Discharge: 2021-10-22 | Disposition: A | Discharge status: Home / Self Care | Payer: Medicare Other | Source: Ambulatory Visit | Attending: Radiation Oncology | Admitting: Radiation Oncology

## 2021-10-22 ENCOUNTER — Ambulatory Visit: Payer: Medicare Other

## 2021-10-22 DIAGNOSIS — C50412 Malignant neoplasm of upper-outer quadrant of left female breast: Secondary | ICD-10-CM | POA: Diagnosis not present

## 2021-10-22 LAB — RAD ONC ARIA SESSION SUMMARY
Course Elapsed Days: 24
Plan Fractions Treated to Date: 16
Plan Prescribed Dose Per Fraction: 2.66 Gy
Plan Total Fractions Prescribed: 16
Plan Total Prescribed Dose: 42.56 Gy
Reference Point Dosage Given to Date: 42.56 Gy
Reference Point Session Dosage Given: 2.66 Gy
Session Number: 16

## 2021-10-23 ENCOUNTER — Ambulatory Visit
Admission: RE | Admit: 2021-10-23 | Discharge: 2021-10-23 | Disposition: A | Discharge status: Home / Self Care | Payer: Medicare Other | Source: Ambulatory Visit | Attending: Radiation Oncology | Admitting: Radiation Oncology

## 2021-10-23 ENCOUNTER — Other Ambulatory Visit: Payer: Self-pay

## 2021-10-23 ENCOUNTER — Ambulatory Visit: Payer: Medicare Other

## 2021-10-23 ENCOUNTER — Telehealth: Payer: Self-pay | Admitting: Hematology

## 2021-10-23 DIAGNOSIS — C50412 Malignant neoplasm of upper-outer quadrant of left female breast: Secondary | ICD-10-CM | POA: Diagnosis not present

## 2021-10-23 LAB — RAD ONC ARIA SESSION SUMMARY
Course Elapsed Days: 25
Plan Fractions Treated to Date: 1
Plan Prescribed Dose Per Fraction: 2 Gy
Plan Total Fractions Prescribed: 4
Plan Total Prescribed Dose: 8 Gy
Reference Point Dosage Given to Date: 44.56 Gy
Reference Point Session Dosage Given: 2 Gy
Session Number: 17

## 2021-10-23 NOTE — Telephone Encounter (Signed)
Per 7/14 phone line pt called and said she did not want to keep the appointment  with Dr. Burr Medico, canceled appointment per pt request

## 2021-10-26 ENCOUNTER — Ambulatory Visit: Payer: Medicare Other

## 2021-10-26 ENCOUNTER — Other Ambulatory Visit: Payer: Self-pay

## 2021-10-26 ENCOUNTER — Encounter: Payer: Self-pay | Admitting: *Deleted

## 2021-10-26 DIAGNOSIS — C50412 Malignant neoplasm of upper-outer quadrant of left female breast: Secondary | ICD-10-CM | POA: Diagnosis not present

## 2021-10-26 LAB — RAD ONC ARIA SESSION SUMMARY
Course Elapsed Days: 28
Plan Fractions Treated to Date: 2
Plan Prescribed Dose Per Fraction: 2 Gy
Plan Total Fractions Prescribed: 4
Plan Total Prescribed Dose: 8 Gy
Reference Point Dosage Given to Date: 46.56 Gy
Reference Point Session Dosage Given: 2 Gy
Session Number: 18

## 2021-10-26 NOTE — Progress Notes (Signed)
                                                                                                                                                              Patient Name: Christina Sanchez MRN: [id number] DOB: June 14, 1950 Referring Physician: Loura Pardon Date of Service: 10/28/2021 [facility], [place]                                                        End Of Treatment Note  Diagnoses: C50.412-Malignant neoplasm of upper-outer quadrant of left female breast  Cancer Staging: Stage IA, pT1bN0M0, grade 2, ER/PR positive invasive lobular carcinoma with LCIS of the left breast  Intent: Curative  Radiation Treatment Dates: 09/28/2021 through 10/28/2021 Site Technique Total Dose (Gy) Dose per Fx (Gy) Completed Fx Beam Energies  Breast, Left: Breast_L 3D 42.56/42.56 2.66 16/16 6XFFF  Breast, Left: Breast_L_Bst 3D 8/8 2 4/4 6X   Narrative: The patient tolerated radiation therapy relatively well. She developed fatigue and anticipated skin changes in the treatment field.   Plan: The patient will receive a call in about one month from the radiation oncology department. She will continue follow up with Dr. Burr Medico as well.   ________________________________________________    Carola Rhine, [facility]

## 2021-10-27 ENCOUNTER — Other Ambulatory Visit: Payer: Self-pay

## 2021-10-27 ENCOUNTER — Ambulatory Visit: Payer: Medicare Other

## 2021-10-27 ENCOUNTER — Ambulatory Visit: Payer: Medicare Other | Admitting: Hematology

## 2021-10-27 ENCOUNTER — Ambulatory Visit
Admission: RE | Admit: 2021-10-27 | Discharge: 2021-10-27 | Disposition: A | Discharge status: Home / Self Care | Payer: Medicare Other | Source: Ambulatory Visit | Attending: Radiation Oncology | Admitting: Radiation Oncology

## 2021-10-27 DIAGNOSIS — C50412 Malignant neoplasm of upper-outer quadrant of left female breast: Secondary | ICD-10-CM | POA: Diagnosis not present

## 2021-10-27 LAB — RAD ONC ARIA SESSION SUMMARY
Course Elapsed Days: 29
Plan Fractions Treated to Date: 3
Plan Prescribed Dose Per Fraction: 2 Gy
Plan Total Fractions Prescribed: 4
Plan Total Prescribed Dose: 8 Gy
Reference Point Dosage Given to Date: 48.56 Gy
Reference Point Session Dosage Given: 2 Gy
Session Number: 19

## 2021-10-28 ENCOUNTER — Other Ambulatory Visit: Payer: Self-pay

## 2021-10-28 ENCOUNTER — Encounter: Payer: Self-pay | Admitting: Radiation Oncology

## 2021-10-28 ENCOUNTER — Ambulatory Visit: Payer: Medicare Other

## 2021-10-28 ENCOUNTER — Ambulatory Visit
Admission: RE | Admit: 2021-10-28 | Discharge: 2021-10-28 | Disposition: A | Discharge status: Home / Self Care | Payer: Medicare Other | Source: Ambulatory Visit | Attending: Radiation Oncology | Admitting: Radiation Oncology

## 2021-10-28 DIAGNOSIS — C50412 Malignant neoplasm of upper-outer quadrant of left female breast: Secondary | ICD-10-CM | POA: Diagnosis not present

## 2021-10-28 LAB — RAD ONC ARIA SESSION SUMMARY
Course Elapsed Days: 30
Plan Fractions Treated to Date: 4
Plan Prescribed Dose Per Fraction: 2 Gy
Plan Total Fractions Prescribed: 4
Plan Total Prescribed Dose: 8 Gy
Reference Point Dosage Given to Date: 50.56 Gy
Reference Point Session Dosage Given: 2 Gy
Session Number: 20

## 2021-10-29 ENCOUNTER — Ambulatory Visit: Payer: Medicare Other

## 2021-12-02 NOTE — Progress Notes (Signed)
  Radiation Oncology         [phone] ________________________________  Name: Christina Sanchez MRN: [id number]  Date of Service: 12/07/2021  DOB: Aug 29, 1950  Post Treatment Telephone Note  Diagnosis:   Stage IA, pT1bN0M0, grade 2, ER/PR positive invasive lobular carcinoma with LCIS of the left breast  Intent: Curative  Radiation Treatment Dates: 09/28/2021 through 10/28/2021 Site Technique Total Dose (Gy) Dose per Fx (Gy) Completed Fx Beam Energies  Breast, Left: Breast_L 3D 42.56/42.56 2.66 16/16 6XFFF  Breast, Left: Breast_L_Bst 3D 8/8 2 4/4 6X   Narrative: The patient tolerated radiation therapy relatively well after delayed surgical healing. She developed fatigue and anticipated skin changes in the treatment field. She reports after carrying heavy bags last week her left arm and under her shoulder blade area has become very swollen and uncomfortable.    Impression/Plan: 1. Stage IA, pT1bN0M0, grade 2, ER/PR positive invasive lobular carcinoma with LCIS of the left breast. The patient has been doing well since completion of radiotherapy. We discussed that we would be happy to continue to follow her as needed, but she will also continue to follow up with Dr. Burr Medico in medical oncology. She was counseled on skin care as well as measures to avoid sun exposure to this area.  2. Left chest wall/LUE edema. I encouraged her to call the PT clinic for evaluation to see if this could be lymphedema and if so for management of this.       Carola Rhine, PAC

## 2021-12-07 ENCOUNTER — Ambulatory Visit
Admission: RE | Admit: 2021-12-07 | Discharge: 2021-12-07 | Disposition: A | Discharge status: Home / Self Care | Payer: Medicare Other | Source: Ambulatory Visit | Attending: Radiation Oncology | Admitting: Radiation Oncology

## 2021-12-07 DIAGNOSIS — Z170 Estrogen receptor positive status [ER+]: Secondary | ICD-10-CM

## 2022-05-13 HISTORY — PX: OTHER SURGICAL HISTORY: SHX169

## 2022-05-27 LAB — LAB REPORT - SCANNED
EGFR: 68
HM HIV Screening: NEGATIVE
HM Hepatitis Screen: NEGATIVE

## 2022-06-23 ENCOUNTER — Other Ambulatory Visit: Payer: Self-pay | Admitting: Family Medicine

## 2022-06-23 DIAGNOSIS — Z853 Personal history of malignant neoplasm of breast: Secondary | ICD-10-CM

## 2022-06-28 ENCOUNTER — Other Ambulatory Visit: Payer: Self-pay | Admitting: Family Medicine

## 2022-06-28 ENCOUNTER — Encounter: Payer: Self-pay | Admitting: Family Medicine

## 2022-06-28 DIAGNOSIS — F17210 Nicotine dependence, cigarettes, uncomplicated: Secondary | ICD-10-CM

## 2022-07-13 ENCOUNTER — Ambulatory Visit
Admission: RE | Admit: 2022-07-13 | Discharge: 2022-07-13 | Disposition: A | Discharge status: Home / Self Care | Payer: Medicare Other | Source: Ambulatory Visit | Attending: Family Medicine | Admitting: Family Medicine

## 2022-07-13 DIAGNOSIS — Z853 Personal history of malignant neoplasm of breast: Secondary | ICD-10-CM

## 2022-07-13 HISTORY — DX: Malignant (primary) neoplasm, unspecified: C80.1

## 2022-07-13 HISTORY — DX: Malignant neoplasm of unspecified site of unspecified female breast: C50.919

## 2022-07-13 HISTORY — DX: Personal history of irradiation: Z92.3

## 2022-07-14 HISTORY — PX: TRANSTHORACIC ECHOCARDIOGRAM: SHX275

## 2022-07-29 ENCOUNTER — Other Ambulatory Visit: Payer: Medicare Other

## 2022-07-29 ENCOUNTER — Inpatient Hospital Stay: Admission: RE | Admit: 2022-07-29 | Payer: Medicare Other | Source: Ambulatory Visit

## 2022-08-31 IMAGING — MG MM DIGITAL DIAGNOSTIC UNILAT*L* W/ TOMO W/ CAD
4 series · 6 of 12 positions shown · non-contrast
Comparison: Previous exam(s).
COMPARISON: Previous exam(s).

Addendum:
CLINICAL DATA: Recall from screening mammography, possible focal
asymmetry involving the outer LEFT breast at middle depth.

EXAM:
DIGITAL DIAGNOSTIC UNILATERAL LEFT MAMMOGRAM WITH TOMOSYNTHESIS AND
CAD; ULTRASOUND LEFT BREAST LIMITED
TECHNIQUE: Left digital diagnostic mammography and breast tomosynthesis was
performed. The images were evaluated with computer-aided detection.;
Targeted ultrasound examination of the left breast was performed.

[L MLO synth-2D]
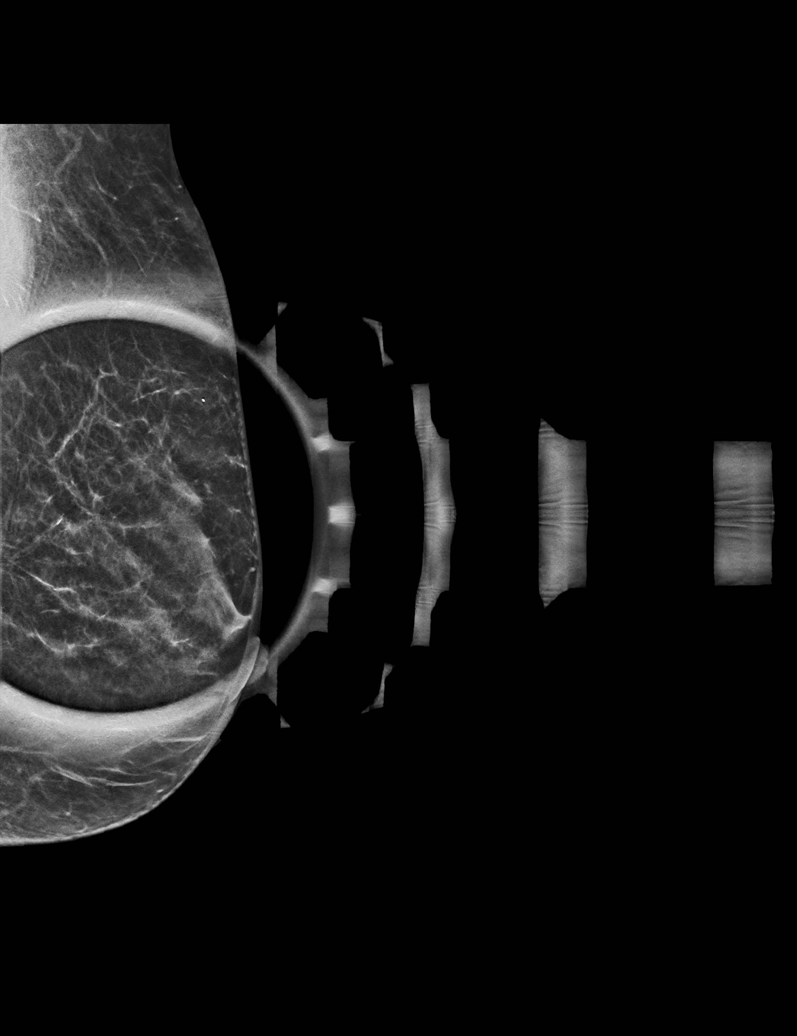

[L CC synth-2D]
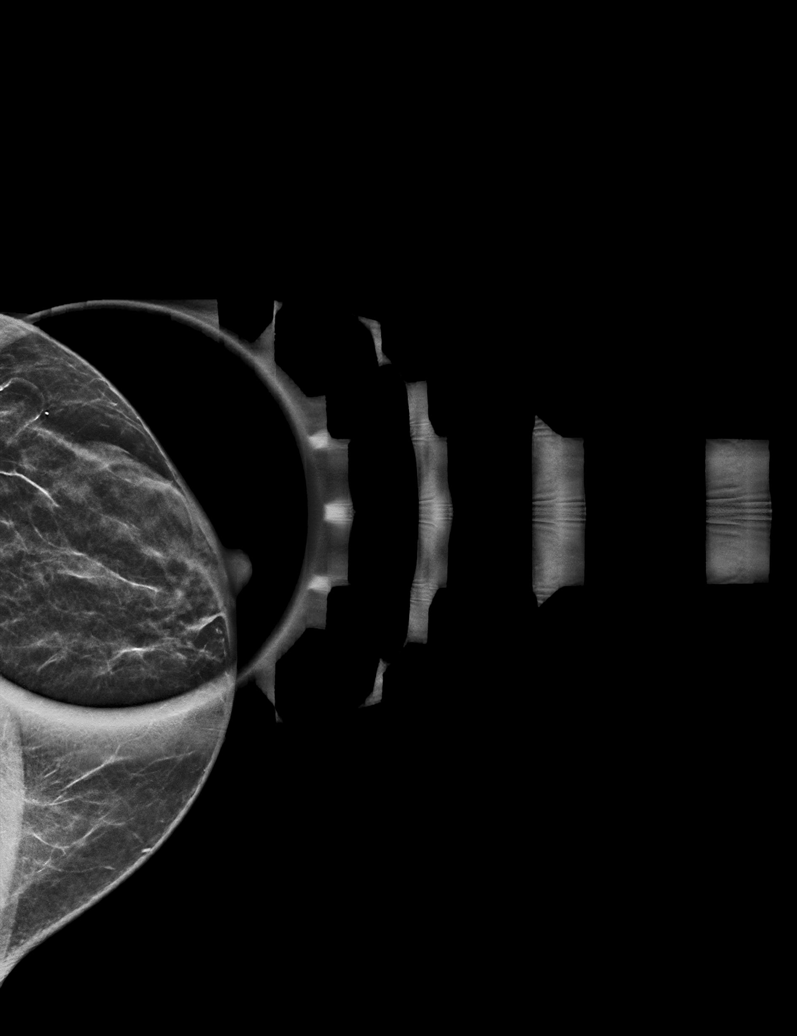

[L CC tomo · 3 of 34 frames shown]
[frame 12/34]
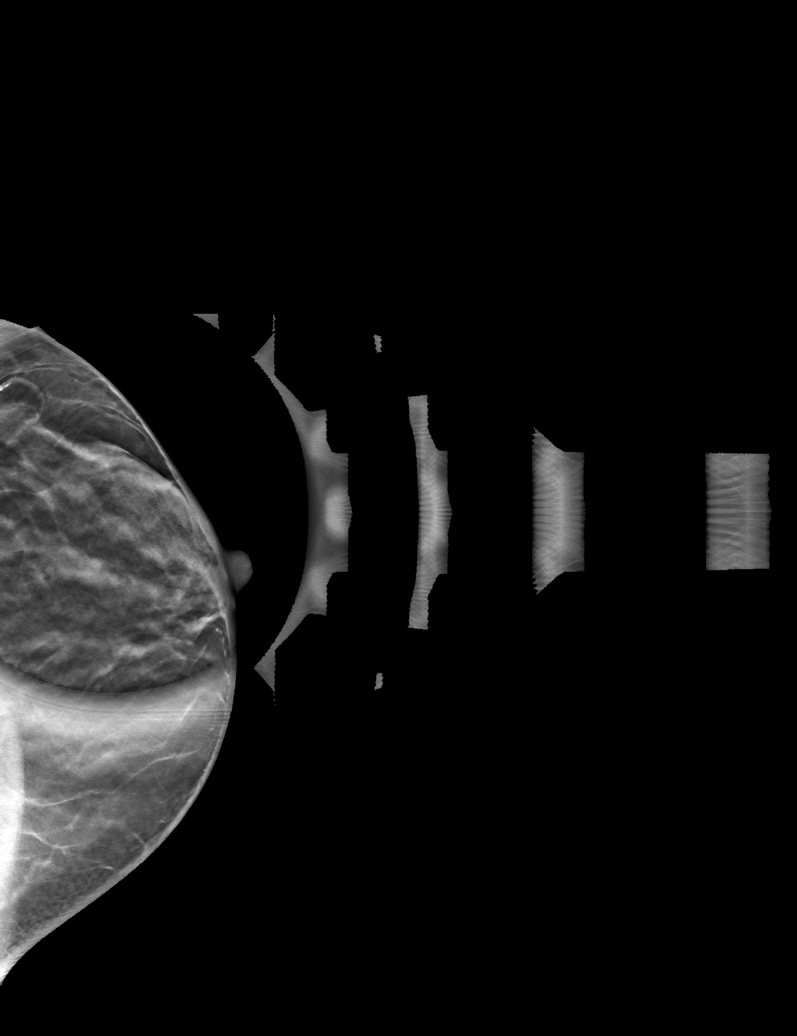
[frame 17/34]
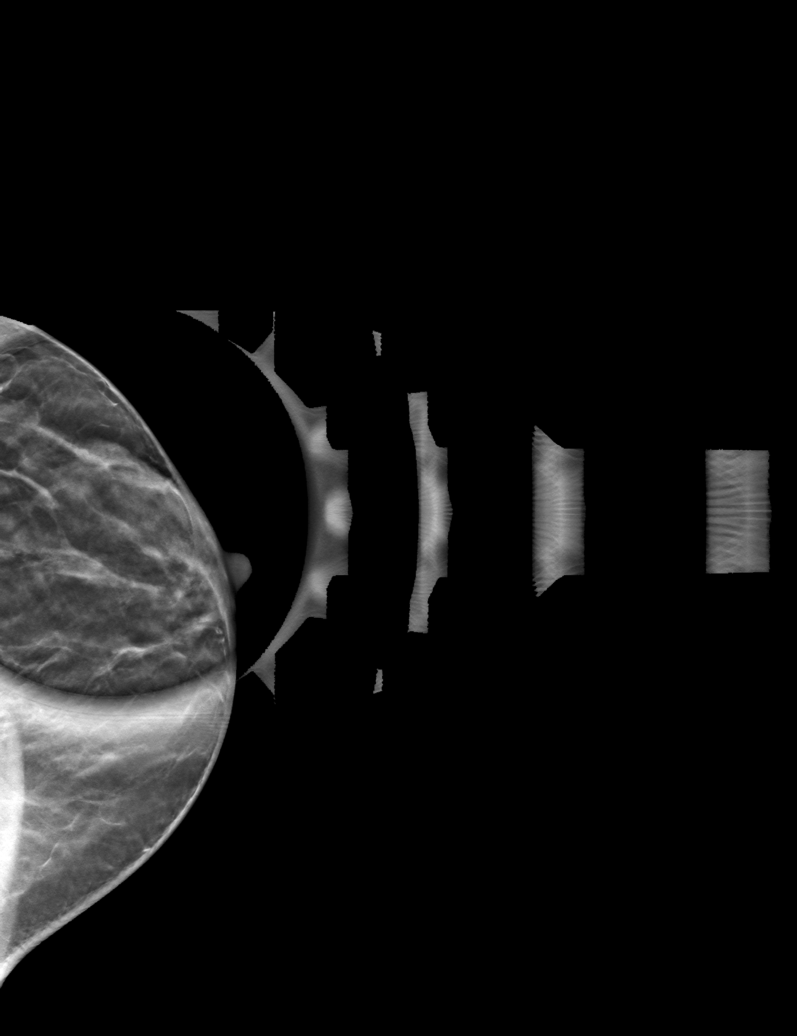
[frame 23/34]
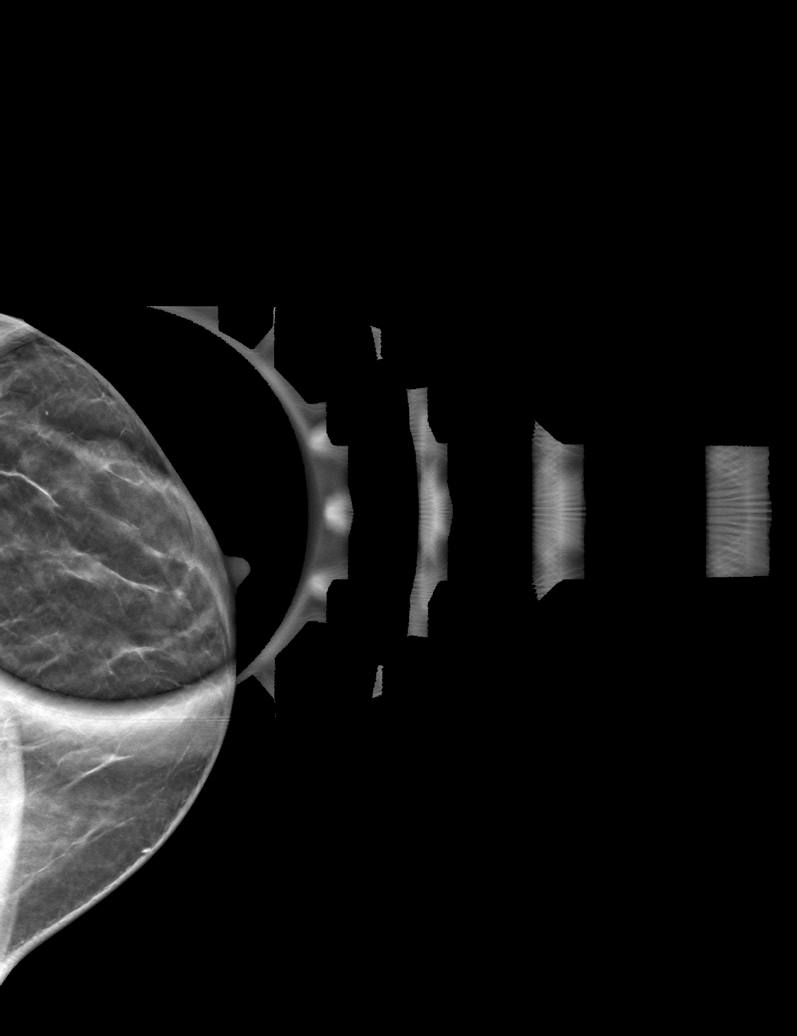

[L MLO tomo · tomo slice 19/38.0]
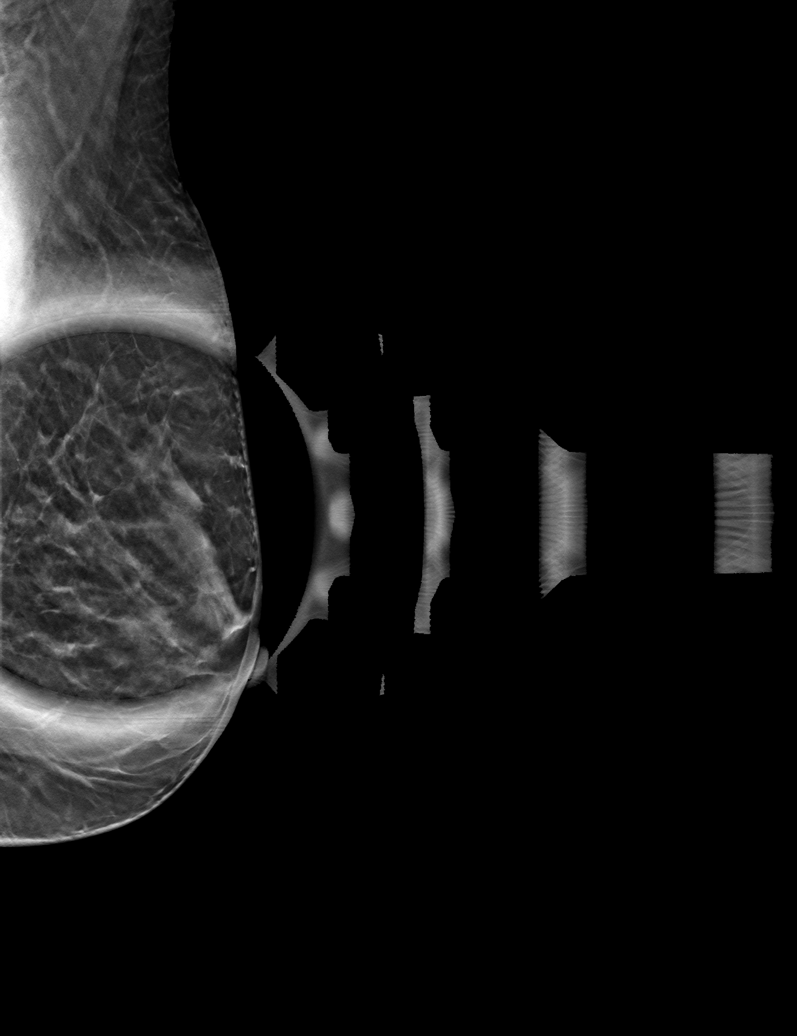

[6 of 12 positions shown; findings below may reference images not displayed]

ACR Breast Density Category c: The breast tissue is heterogeneously
dense, which may obscure small masses.
FINDINGS: Spot-compression CC and MLO views of the area of concern were
obtained.

The focal asymmetry in the outer breast at middle depth persists on
the spot compression views. There is no underlying architectural
distortion or suspicious calcifications.

Targeted ultrasound is performed, demonstrating an irregular
hypoechoic mass with angular margins at the 10 o'clock position 2 cm
from nipple measuring approximately 8 x 5 x 8 mm, demonstrating
posterior acoustic shadowing and demonstrating peripheral power
Doppler flow, located within dense fibroglandular tissue,
corresponding to the screening mammographic finding.

Sonographic evaluation of the LEFT axilla demonstrates no pathologic
lymphadenopathy.

On correlative physical examination, there is vague palpable
firmness in the upper-outer periareolar location at the site of the
sonographic mass.
IMPRESSION: 1. Highly suspicious 8 mm mass in the UPPER OUTER QUADRANT of the
LEFT breast at the 10 o'clock position 2 cm from nipple.
2. No pathologic LEFT axillary lymphadenopathy.

RECOMMENDATION:
Ultrasound-guided core needle biopsy of the suspicious LEFT breast
mass.

I have discussed the findings and recommendations with the patient.
The ultrasound core needle biopsy procedure was discussed with the
patient and her questions were answered. She wishes to proceed with
the biopsy which has been scheduled by the Breast Imaging staff.

BI-RADS CATEGORY  5: Highly suggestive of malignancy.

ADDENDUM:
Correction: The mass in the LEFT breast is at the 2 o'clock position
2 cm from the nipple.

*** End of Addendum ***
ACR Breast Density Category c: The breast tissue is heterogeneously
dense, which may obscure small masses.
FINDINGS: Spot-compression CC and MLO views of the area of concern were
obtained.

The focal asymmetry in the outer breast at middle depth persists on
the spot compression views. There is no underlying architectural
distortion or suspicious calcifications.

Targeted ultrasound is performed, demonstrating an irregular
hypoechoic mass with angular margins at the 10 o'clock position 2 cm
from nipple measuring approximately 8 x 5 x 8 mm, demonstrating
posterior acoustic shadowing and demonstrating peripheral power
Doppler flow, located within dense fibroglandular tissue,
corresponding to the screening mammographic finding.

Sonographic evaluation of the LEFT axilla demonstrates no pathologic
lymphadenopathy.

On correlative physical examination, there is vague palpable
firmness in the upper-outer periareolar location at the site of the
sonographic mass.
IMPRESSION: 1. Highly suspicious 8 mm mass in the UPPER OUTER QUADRANT of the
LEFT breast at the 10 o'clock position 2 cm from nipple.
2. No pathologic LEFT axillary lymphadenopathy.

RECOMMENDATION:
Ultrasound-guided core needle biopsy of the suspicious LEFT breast
mass.

I have discussed the findings and recommendations with the patient.
The ultrasound core needle biopsy procedure was discussed with the
patient and her questions were answered. She wishes to proceed with
the biopsy which has been scheduled by the Breast Imaging staff.

BI-RADS CATEGORY  5: Highly suggestive of malignancy.

## 2022-08-31 IMAGING — US US BREAST*L* LIMITED INC AXILLA
1 series · 7 of 7 positions shown · non-contrast
Comparison: Previous exam(s).
COMPARISON: Previous exam(s).

Addendum:
CLINICAL DATA: Recall from screening mammography, possible focal
asymmetry involving the outer LEFT breast at middle depth.

EXAM:
DIGITAL DIAGNOSTIC UNILATERAL LEFT MAMMOGRAM WITH TOMOSYNTHESIS AND
CAD; ULTRASOUND LEFT BREAST LIMITED
TECHNIQUE: Left digital diagnostic mammography and breast tomosynthesis was
performed. The images were evaluated with computer-aided detection.;
Targeted ultrasound examination of the left breast was performed.

[Series 1: us breast*left* limited inc axilla · 0.05mm/px · 7 of 7 slices shown]
[im 1/7]
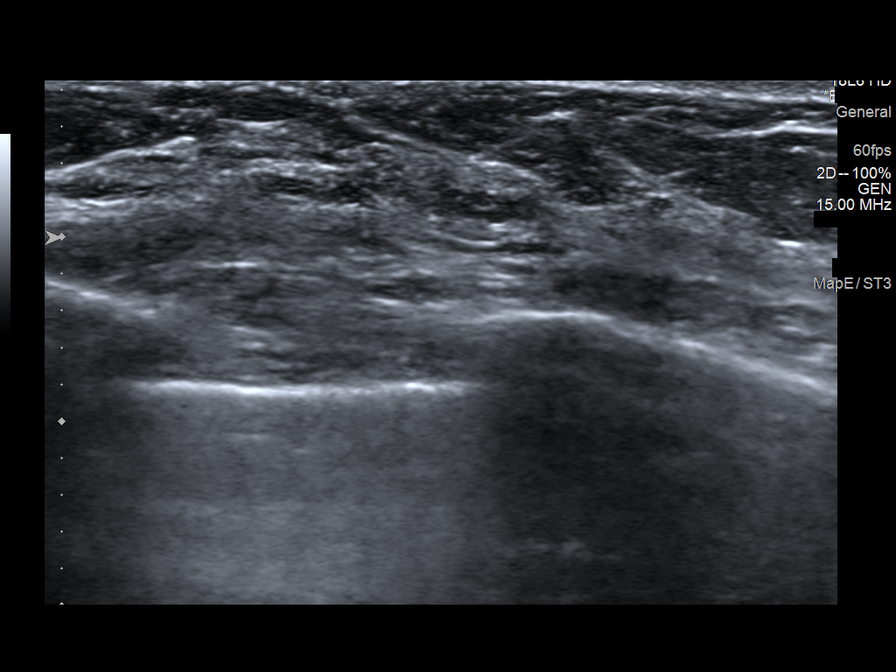
[im 2/7]
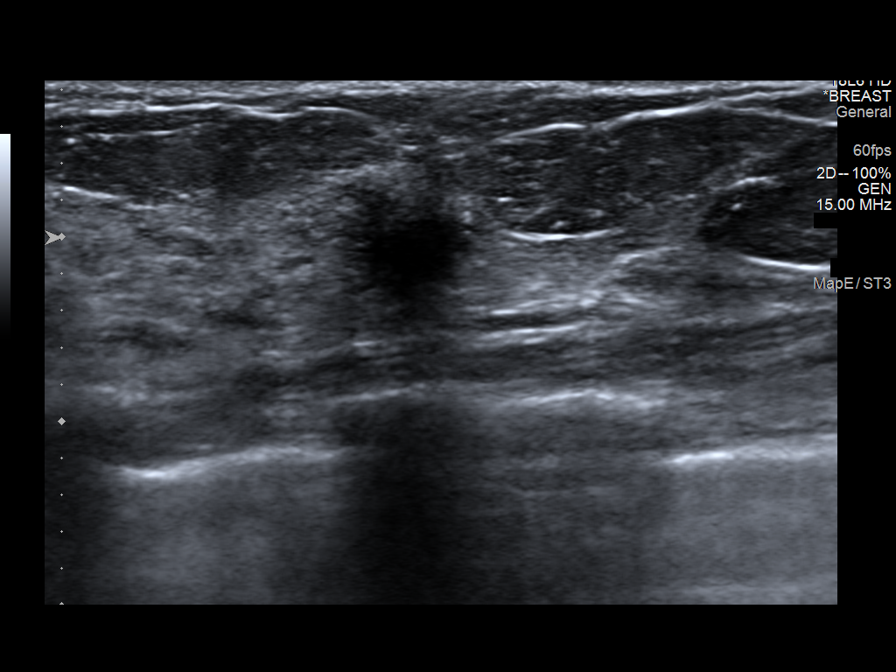
[im 3/7]
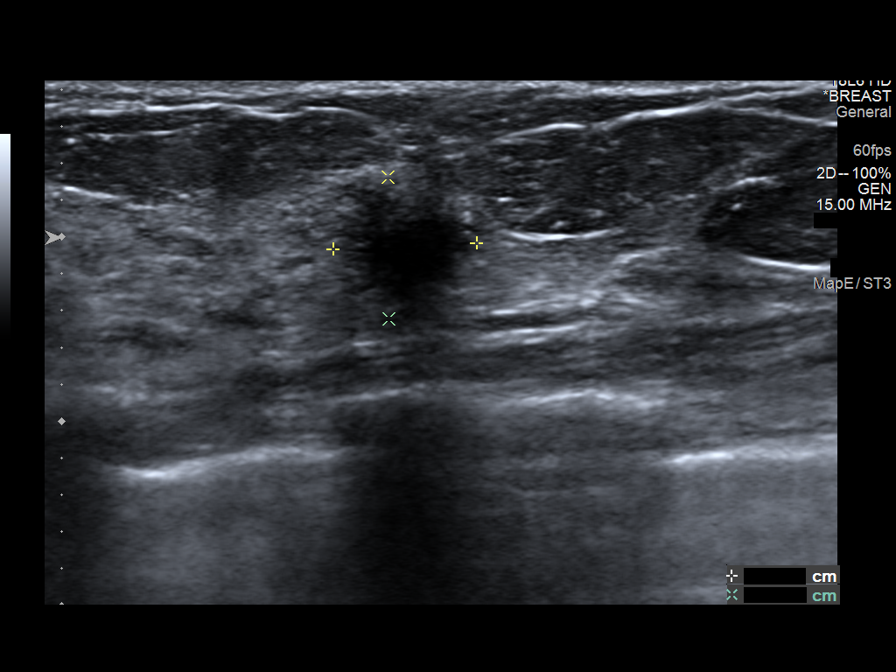
[im 4/7]
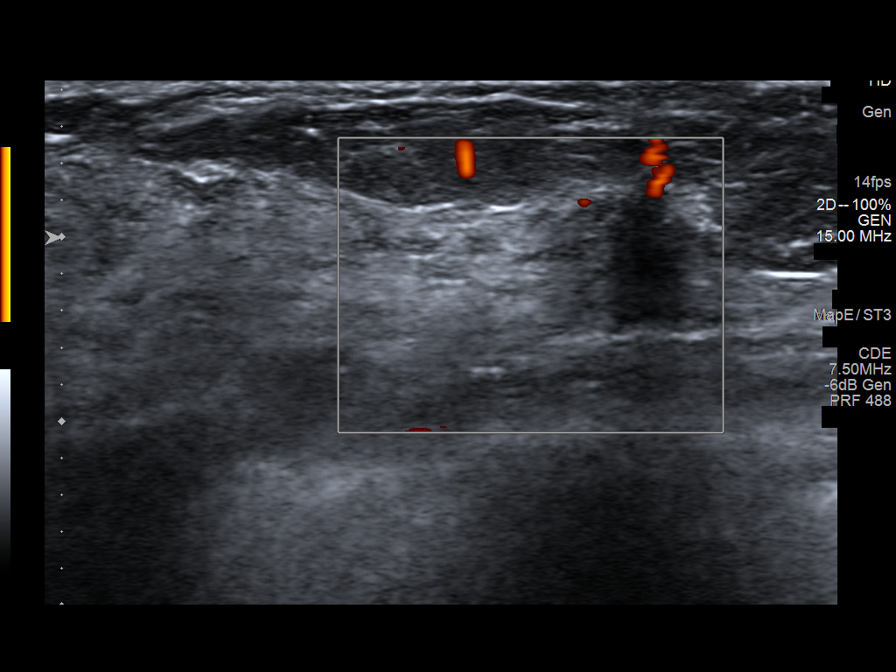
[im 5/7]
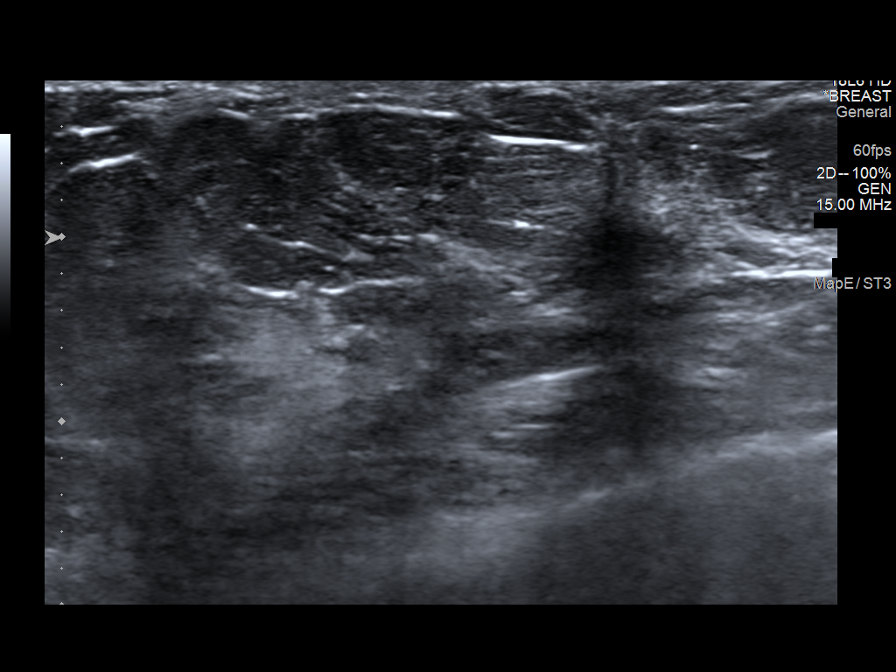
[im 6/7]
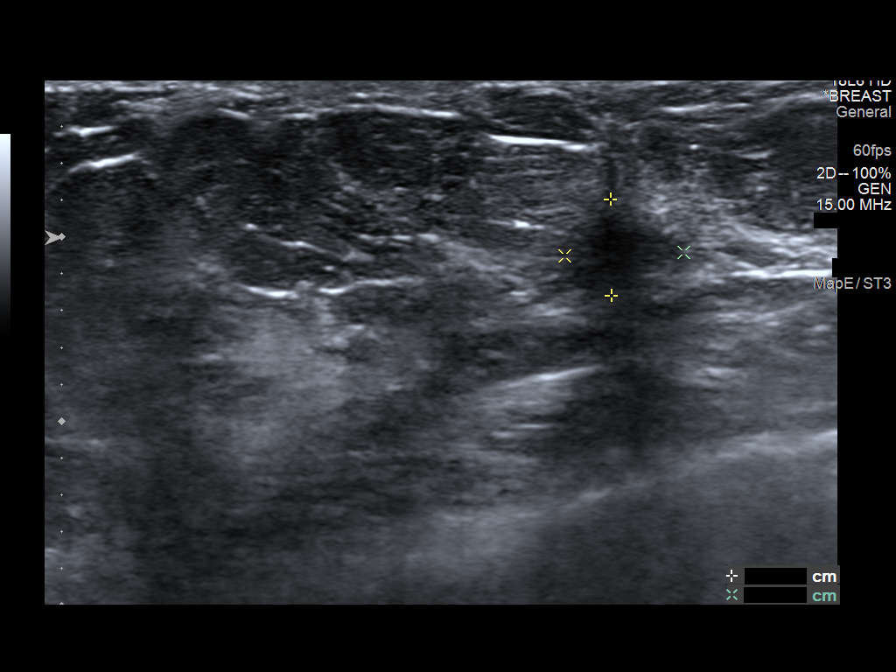
[im 7/7]
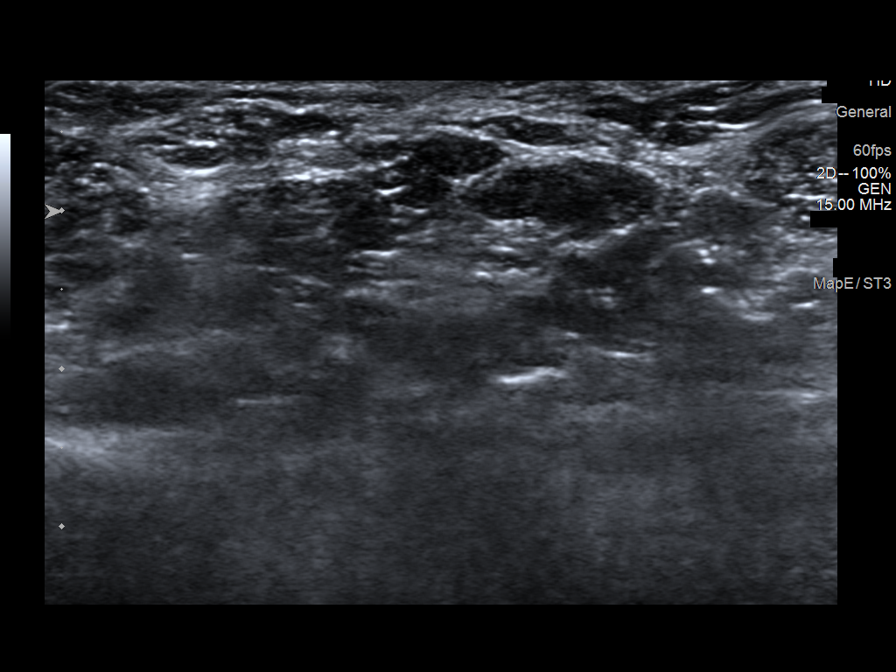

[7 of 7 positions shown; findings below may reference images not displayed]

ACR Breast Density Category c: The breast tissue is heterogeneously
dense, which may obscure small masses.
FINDINGS: Spot-compression CC and MLO views of the area of concern were
obtained.

The focal asymmetry in the outer breast at middle depth persists on
the spot compression views. There is no underlying architectural
distortion or suspicious calcifications.

Targeted ultrasound is performed, demonstrating an irregular
hypoechoic mass with angular margins at the 10 o'clock position 2 cm
from nipple measuring approximately 8 x 5 x 8 mm, demonstrating
posterior acoustic shadowing and demonstrating peripheral power
Doppler flow, located within dense fibroglandular tissue,
corresponding to the screening mammographic finding.

Sonographic evaluation of the LEFT axilla demonstrates no pathologic
lymphadenopathy.

On correlative physical examination, there is vague palpable
firmness in the upper-outer periareolar location at the site of the
sonographic mass.
IMPRESSION: 1. Highly suspicious 8 mm mass in the UPPER OUTER QUADRANT of the
LEFT breast at the 10 o'clock position 2 cm from nipple.
2. No pathologic LEFT axillary lymphadenopathy.

RECOMMENDATION:
Ultrasound-guided core needle biopsy of the suspicious LEFT breast
mass.

I have discussed the findings and recommendations with the patient.
The ultrasound core needle biopsy procedure was discussed with the
patient and her questions were answered. She wishes to proceed with
the biopsy which has been scheduled by the Breast Imaging staff.

BI-RADS CATEGORY  5: Highly suggestive of malignancy.

ADDENDUM:
Correction: The mass in the LEFT breast is at the 2 o'clock position
2 cm from the nipple.

*** End of Addendum ***
ACR Breast Density Category c: The breast tissue is heterogeneously
dense, which may obscure small masses.
FINDINGS: Spot-compression CC and MLO views of the area of concern were
obtained.

The focal asymmetry in the outer breast at middle depth persists on
the spot compression views. There is no underlying architectural
distortion or suspicious calcifications.

Targeted ultrasound is performed, demonstrating an irregular
hypoechoic mass with angular margins at the 10 o'clock position 2 cm
from nipple measuring approximately 8 x 5 x 8 mm, demonstrating
posterior acoustic shadowing and demonstrating peripheral power
Doppler flow, located within dense fibroglandular tissue,
corresponding to the screening mammographic finding.

Sonographic evaluation of the LEFT axilla demonstrates no pathologic
lymphadenopathy.

On correlative physical examination, there is vague palpable
firmness in the upper-outer periareolar location at the site of the
sonographic mass.
IMPRESSION: 1. Highly suspicious 8 mm mass in the UPPER OUTER QUADRANT of the
LEFT breast at the 10 o'clock position 2 cm from nipple.
2. No pathologic LEFT axillary lymphadenopathy.

RECOMMENDATION:
Ultrasound-guided core needle biopsy of the suspicious LEFT breast
mass.

I have discussed the findings and recommendations with the patient.
The ultrasound core needle biopsy procedure was discussed with the
patient and her questions were answered. She wishes to proceed with
the biopsy which has been scheduled by the Breast Imaging staff.

BI-RADS CATEGORY  5: Highly suggestive of malignancy.

## 2022-09-13 ENCOUNTER — Ambulatory Visit: Payer: Medicare Other | Attending: Cardiology | Admitting: Cardiology

## 2022-09-13 ENCOUNTER — Encounter: Payer: Self-pay | Admitting: Cardiology

## 2022-09-13 VITALS — BP 110/80 | HR 100 | Ht 65.0 in | Wt 138.8 lb

## 2022-09-13 DIAGNOSIS — E782 Mixed hyperlipidemia: Secondary | ICD-10-CM

## 2022-09-13 DIAGNOSIS — R0609 Other forms of dyspnea: Secondary | ICD-10-CM | POA: Insufficient documentation

## 2022-09-13 DIAGNOSIS — R9431 Abnormal electrocardiogram [ECG] [EKG]: Secondary | ICD-10-CM | POA: Diagnosis not present

## 2022-09-13 DIAGNOSIS — I10 Essential (primary) hypertension: Secondary | ICD-10-CM | POA: Insufficient documentation

## 2022-09-13 DIAGNOSIS — I493 Ventricular premature depolarization: Secondary | ICD-10-CM

## 2022-09-13 DIAGNOSIS — I519 Heart disease, unspecified: Secondary | ICD-10-CM

## 2022-09-13 DIAGNOSIS — E785 Hyperlipidemia, unspecified: Secondary | ICD-10-CM | POA: Insufficient documentation

## 2022-09-13 DIAGNOSIS — F17200 Nicotine dependence, unspecified, uncomplicated: Secondary | ICD-10-CM

## 2022-09-13 NOTE — Progress Notes (Unsigned)
Primary Care Provider: Sharmon Revere, MD [facility] Cardiologist: None Electrophysiologist: None  Clinic Note: No chief complaint on file.   ===================================  ASSESSMENT/PLAN   Problem List Items Addressed This Visit   None   ===================================  HPI:    Christina Sanchez is a 72 y.o. female who is being seen today for the evaluation of *** at the request of Paliwal, Himanshu, MD.  Christina Sanchez was last seen on ***  Recent Hospitalizations: ***  Reviewed  CV studies:    The following studies were reviewed today: (if available, images/films reviewed: From Epic Chart or Care Everywhere) Echo 08/04/2022: : IMPRESSIONS 1. Left ventricular ejection fraction, by estimation, is 60 to 65% . Left ventricular ejection fraction by 3D volume is 62 % . The left ventricle has normal function. The left ventricle has no regional wall motion abnormalities. Left ventricular diastolic parameters are consistent with Grade I diastolic dysfunction ( impaired relaxation) . 2. Right ventricular systolic function is normal. The right ventricular size is normal. There is normal pulmonary artery systolic pressure. The estimated right ventricular systolic pressure is 27. 6 mmHg. 3. The mitral valve is normal in structure. Mild mitral valve regurgitation. 4. The aortic valve is tricuspid. Aortic valve regurgitation is not visualized. Aortic valve sclerosis is present, with no evidence of aortic valve stenosis. 5. The inferior vena cava is normal in size with greater than 50% respiratory variability, suggesting right atrial pressure of 3 mmHg.  Interval History:   [name]   CV Review of Symptoms (Summary): Cardiovascular ROS: {roscv:310661}  REVIEWED OF SYSTEMS   ROS  I have reviewed and (if needed) personally updated the patient's problem list, medications, allergies, past medical and surgical history, social and family history.    PAST MEDICAL HISTORY   Past Medical History:  Diagnosis Date   Arthritis    Bilateral occipital neuralgia    Breast cancer (HCC)    Cancer (HCC)    Hypertension    Personal history of radiation therapy     PAST SURGICAL HISTORY   Past Surgical History:  Procedure Laterality Date   BREAST LUMPECTOMY     BREAST LUMPECTOMY WITH RADIOACTIVE SEED AND SENTINEL LYMPH NODE BIOPSY Left 06/30/2021   Procedure: LEFT BREAST LUMPECTOMY WITH RADIOACTIVE SEED AND SENTINEL LYMPH NODE BIOPSY;  Surgeon: Harriette Bouillon, MD;  Location: [facility];  Service: General;  Laterality: Left;   fistula track removal     rectal   FOOT SURGERY     PARTIAL HYSTERECTOMY       There is no immunization history on file for this patient.  MEDICATIONS/ALLERGIES   Current Meds  Medication Sig   albuterol (VENTOLIN HFA) 108 (90 Base) MCG/ACT inhaler Inhale 2 puffs into the lungs as needed.   amLODipine-benazepril (LOTREL) 5-10 MG capsule Take 1 capsule by mouth daily.   atorvastatin (LIPITOR) 10 MG tablet Take 10 mg by mouth every morning.   Cyanocobalamin (B-12) 1000 MCG CAPS Take 1 capsule by mouth daily.   diclofenac Sodium (VOLTAREN) 1 % GEL Apply topically as needed.   fluticasone (FLONASE) 50 MCG/ACT nasal spray Place 2 sprays into both nostrils as needed.   mirtazapine (REMERON) 15 MG tablet Take 15 mg by mouth at bedtime.   zolpidem (AMBIEN) 5 MG tablet Take 5 mg by mouth at bedtime as needed.    Allergies  Allergen Reactions   Aspirin     "burning feeling in my stomach". She can tolerate 1 Aleve.  Latex Nausea Only   Lexapro [Escitalopram] Diarrhea    SOCIAL HISTORY/FAMILY HISTORY   Reviewed in Epic:   Social History   Tobacco Use   Smoking status: Every Day    Packs/day: 0.50    Years: 50.00    Additional pack years: 0.00    Total pack years: 25.00    Types: Cigarettes   Smokeless tobacco: Never   Tobacco comments:    8-9 cigarattes/day  Vaping Use   Vaping  Use: Never used  Substance Use Topics   Alcohol use: Yes    Alcohol/week: 15.0 standard drinks of alcohol    Types: 15 Glasses of wine per week    Comment: occ wine   Drug use: No   Social History   Social History Narrative   Lives at home with her son   Right handed   Caffeine: 1 cup of tea daily   Family History  Problem Relation Age of Onset   Diabetes Brother    Migraines Neg Hx    Headache Neg Hx    Breast cancer Neg Hx     OBJCTIVE -PE, EKG, labs   Wt Readings from Last 3 Encounters:  09/13/22 138 lb 12.8 oz (63 kg)  07/30/21 134 lb (60.8 kg)  06/30/21 132 lb 7.9 oz (60.1 kg)    Physical Exam: BP 110/80   Pulse 100   Ht 5\' 5"  (1.651 m)   Wt 138 lb 12.8 oz (63 kg)   SpO2 97%   BMI 23.10 kg/m  Physical Exam   Adult ECG Report  Rate: *** ;  Rhythm: {rhythm:17366};   Narrative Interpretation: ***  Recent Labs:  ***  No results found for: "CHOL", "HDL", "LDLCALC", "LDLDIRECT", "TRIG", "CHOLHDL" Lab Results  Component Value Date   CREATININE 0.71 06/10/2021   BUN 8 06/10/2021   NA 139 06/10/2021   K 4.3 06/10/2021   CL 104 06/10/2021   CO2 27 06/10/2021      Latest Ref Rng & Units 06/10/2021   12:24 PM 03/21/2016   12:19 PM  CBC  WBC 4.0 - 10.5 K/uL 6.7  6.9   Hemoglobin 12.0 - 15.0 g/dL 86.5  78.4   Hematocrit 36.0 - 46.0 % 39.8  39.8   Platelets 150 - 400 K/uL 381  251     No results found for: "HGBA1C" No results found for: "TSH"  ================================================== I spent a total of *** minutes with the patient spent in direct patient consultation.  Additional time spent with chart review  / charting (studies, outside notes, etc): *** min Total Time: *** min  Current medicines are reviewed at length with the patient today.  (+/- concerns) ***  Notice: This dictation was prepared with Dragon dictation along with smart phrase technology. Any transcriptional errors that result from this process are unintentional and may not  be corrected upon review.   Studies Ordered:  No orders of the defined types were placed in this encounter.  No orders of the defined types were placed in this encounter.   Patient Instructions / Medication Changes & Studies & Tests Ordered   There are no Patient Instructions on file for this visit.    Marykay Lex, MD, MS Bryan Lemma, M.D., M.S. Interventional Cardiologist  [facility]  Pager # [phone] Phone # [phone] [address] [place], [place] [address]   Thank you for choosing [facility]!!

## 2022-09-13 NOTE — Patient Instructions (Addendum)
Medication Instructions:  No changes     Lab Work: Not needed    Testing/Procedures:  will be sche[place]el at [address]  Your doctor has scheduled you for a Myocardial Perfusion scan to obtain information about the blood flow to your heart. The test consists of taking pictures of your heart in two phases: while resting and after a stress test.  The stress test will involve you  walking on a treadmill.  The test will take approximately 3 to 4  hours to complete. How to prepare for your test: Do not eat or drink 2 hours prior to your test Do not consume products containing caffeine 12 hours prior to your test (examples: coffee (regular OR decaf), chocolate, sodas, tea) Your doctor may need you to hold certain medications prior to the test.  If so, these are listed below and should not be taken for 24 hours prior to the test.  If not listed below, you may take your medications as normal.  You may resume taking held medications on your normal schedule once the test is complete.   Meds to hold: none Do bring a list of your current medications with you.  If you have held any meds in preparation for the test, please bring them, as you may be required to take them once the test is completed. Do wear comfortable clothes and walking shoes.  Do not wear dresses or overalls. Do NOT wear cologne, perfume, aftershave, or fragranced lotions the day of your test (deodorants okay). If these instructions are not followed your test will have to be rescheduled.   A nuclear cardiologist will review your test, prepare a report and send it to your physician.   If you have questions or concerns about your appointment, you can call the Nuclear Cardiology department at [phone]. If you cannot keep your appointment, please provide 48 hours notification to avoid a possible $50.00 charge to your account.   Please arrive 15 minutes prior to your appointment time for registration and  insurance purposes    Follow-Up: At [facility], you and your health needs are our priority.  As part of our continuing mission to provide you with exceptional heart care, we have created designated Provider Care Teams.  These Care Teams include your primary Cardiologist (physician) and Advanced Practice Providers (APPs -  Physician Assistants and Nurse Practitioners) who all work together to provide you with the care you need, when you need it.     Your next appointment:    2 to 3 month(s)  The format for your next appointment:   In Person  Provider:   Bryan Lemma, MD    Other Instructions

## 2022-09-14 ENCOUNTER — Telehealth: Payer: Self-pay | Admitting: Cardiology

## 2022-09-14 ENCOUNTER — Telehealth (HOSPITAL_COMMUNITY): Payer: Self-pay | Admitting: *Deleted

## 2022-09-14 NOTE — Telephone Encounter (Signed)
Left vm to schedule 2-27m f/u appt with Dr. Herbie Baltimore, per AVS from 09-13-22.  J.Britt, 09-14-22

## 2022-09-14 NOTE — Telephone Encounter (Signed)
Left detailed instructions for MPI study. 

## 2022-09-16 ENCOUNTER — Encounter: Payer: Self-pay | Admitting: Cardiology

## 2022-09-16 ENCOUNTER — Ambulatory Visit (HOSPITAL_COMMUNITY): Payer: Medicare Other

## 2022-09-16 DIAGNOSIS — I519 Heart disease, unspecified: Secondary | ICD-10-CM | POA: Insufficient documentation

## 2022-09-16 NOTE — Assessment & Plan Note (Signed)
Grade 1 diastolic dysfunction otherwise known as abnormal relaxation is actually in normal for age in a 72 year old.  Normal for age greater than 34.  This does not indicate a diagnosis of heart failure which is a clinical diagnosis.  She denies any PND, orthopnea and only trivial edema.  That would argue against CHF.  In fact she has normal RV size and pressure which would suggest that there is no increased pulmonary arterial pressures either.  Condition would be afterload reduction/BP control which is already being done with ACE inhibitor and amlodipine. The only other additional option would be to consider beta-blockade which reluctant to do given her requirement for albuterol PRN.

## 2022-09-16 NOTE — Assessment & Plan Note (Signed)
EKG findings are nonspecific, but could potentially be suggestive of ischemia or prior infarct.  Echo did not suggest infarct.  Based on her exertion on aspirin and PVCs we will check Treadmill Myoview  Thankfully, no structural abnormalities on echo.

## 2022-09-16 NOTE — Assessment & Plan Note (Signed)
Exertional dyspnea with normal echo.  She says that when she pushes it she will get some chest tightness.  Given the PVCs on the monitor, would like to exclude ischemia.  Plan: Check Treadmill Myoview Stress Test -> this will assess her exercise tolerance, chronotropic competence, and assess if there increased PVCs with activity.  It is possible that grade 1 diastolic function can worsen with exertion -> if Myoview is negative and symptoms are persistent, could consider CPX evaluation to determine cardiac versus pulmonary etiology.  Shared Decision Making/Informed Consent The risks [chest pain, shortness of breath, cardiac arrhythmias, dizziness, blood pressure fluctuations, myocardial infarction, stroke/transient ischemic attack, nausea, vomiting, allergic reaction, radiation exposure, metallic taste sensation and life-threatening complications (estimated to be 1 in 10,000)], benefits (risk stratification, diagnosing coronary artery disease, treatment guidance) and alternatives of a nuclear stress test were discussed in detail with Christina Sanchez and she agrees to proceed.

## 2022-09-16 NOTE — Assessment & Plan Note (Signed)
Currently on atorvastatin 10 mg daily with an LDL of 86.  Pretty well at goal [place] she has known CAD.

## 2022-09-16 NOTE — Assessment & Plan Note (Addendum)
Symptomatic PVCs.  I like to see if these increase with exertion and evaluate for ischemia.  Would prefer to avoid treating if there is no evidence of CAD.  In the absence of ischemia with a normal echocardiogram, they are benign, and reassurance to be reasonable.  Plan: For now evaluated with Treadmill Myoview, and avoid beta-blocker unless abnormal.

## 2022-09-16 NOTE — Assessment & Plan Note (Signed)
BP is very well-controlled on current dose of amlodipine-benazepril. If we do initiate beta-blocker because of her palpitations, would probably need to reduce this dose.

## 2022-09-16 NOTE — Assessment & Plan Note (Signed)
Not yet ready to quit. 

## 2022-09-20 ENCOUNTER — Ambulatory Visit (HOSPITAL_COMMUNITY): Payer: Medicare Other

## 2022-09-24 ENCOUNTER — Telehealth (HOSPITAL_COMMUNITY): Payer: Self-pay | Admitting: *Deleted

## 2022-09-24 NOTE — Telephone Encounter (Signed)
Called patient as a reminder about her STRESS TEST on 09/29/22 at 7:15. Also reminded her to wear sneakers and comfortable clothes for the TREADMILL.

## 2022-09-29 ENCOUNTER — Ambulatory Visit (HOSPITAL_COMMUNITY): Payer: Medicare Other | Attending: Cardiology

## 2022-09-29 DIAGNOSIS — R9431 Abnormal electrocardiogram [ECG] [EKG]: Secondary | ICD-10-CM | POA: Diagnosis not present

## 2022-09-29 DIAGNOSIS — R0609 Other forms of dyspnea: Secondary | ICD-10-CM

## 2022-09-29 DIAGNOSIS — I493 Ventricular premature depolarization: Secondary | ICD-10-CM | POA: Diagnosis present

## 2022-09-29 HISTORY — PX: NM MYOVIEW LTD: HXRAD82

## 2022-09-29 LAB — MYOCARDIAL PERFUSION IMAGING
LV dias vol: 86 mL (ref 46–106)
LV sys vol: 51 mL
Nuc Stress EF: 41 %
Peak HR: 98 {beats}/min
Rest HR: 86 {beats}/min
Rest Nuclear Isotope Dose: 10.9 mCi
SDS: 6
SRS: 1
SSS: 7
Stress Nuclear Isotope Dose: 31.6 mCi
TID: 1.04

## 2022-09-29 MED ORDER — TECHNETIUM TC 99M TETROFOSMIN IV KIT
10.9000 | PACK | Freq: Once | INTRAVENOUS | Status: AC | PRN
  Administered 2022-09-29: 10.9 via INTRAVENOUS

## 2022-09-29 MED ORDER — REGADENOSON 0.4 MG/5ML IV SOLN
0.4000 mg | Freq: Once | INTRAVENOUS | Status: AC
  Administered 2022-09-29: 0.4 mg via INTRAVENOUS

## 2022-09-29 MED ORDER — TECHNETIUM TC 99M TETROFOSMIN IV KIT
31.6000 | PACK | Freq: Once | INTRAVENOUS | Status: AC | PRN
  Administered 2022-09-29: 31.6 via INTRAVENOUS

## 2022-09-30 ENCOUNTER — Telehealth: Payer: Self-pay | Admitting: Cardiology

## 2022-09-30 NOTE — Telephone Encounter (Signed)
Pt states her legs are hurting and swollen due to her treadmill stress test yesterday. She states she has put a cold compress on them but it has not worked very well. She wants to know if Dr. Herbie Baltimore can prescribe her some pain medications. Please advise.

## 2022-09-30 NOTE — Telephone Encounter (Signed)
Patient states swelling to late yesterday.  Knees and ankles are swollen today as well. Per patient discussion no pitting edema. Left foot slight swelling.  The right knee hurts.  States her knee cap "moves"  (R Leg)and cannot walk without it moving.  She walks bent over due to pain.  She states wearing a brief last night and today because to hard to get up to the bathroom. She tool tylenol last night with no relief. She states she didn't think she could do this  test but Dr Herbie Baltimore wanted her to try.  She could not finish the actual walk part so she was given the medication to complete the test.  Advised continue tylenol, heat 20 minute intervals. Elevate with pillow under Right knee since it has pain. Any further recommendations? Please advise

## 2022-10-06 NOTE — Telephone Encounter (Signed)
Recommend talking with PCP (or if she has one - her Orthopedic Surgeon) to help manage arthritis pain.  I do not prescribe pain medications in the out patient setting -- based upon my Licencsure.   Bryan Lemma, MD

## 2022-10-07 NOTE — Telephone Encounter (Signed)
Spoke with patient.  Gave instruction per the doctor.  She has spoken to her PCP and is on pain medication from PCP and doing better now

## 2022-10-20 ENCOUNTER — Telehealth: Payer: Self-pay

## 2022-10-20 ENCOUNTER — Ambulatory Visit: Payer: Medicare Other | Attending: Cardiology | Admitting: Cardiology

## 2022-10-20 ENCOUNTER — Ambulatory Visit: Payer: Medicare Other | Admitting: Cardiology

## 2022-10-20 ENCOUNTER — Telehealth: Payer: Self-pay | Admitting: *Deleted

## 2022-10-20 ENCOUNTER — Encounter (HOSPITAL_COMMUNITY): Payer: Self-pay

## 2022-10-20 ENCOUNTER — Encounter: Payer: Self-pay | Admitting: Cardiology

## 2022-10-20 VITALS — BP 156/105 | Ht 66.0 in | Wt 138.0 lb

## 2022-10-20 DIAGNOSIS — I10 Essential (primary) hypertension: Secondary | ICD-10-CM

## 2022-10-20 DIAGNOSIS — E782 Mixed hyperlipidemia: Secondary | ICD-10-CM

## 2022-10-20 DIAGNOSIS — R9439 Abnormal result of other cardiovascular function study: Secondary | ICD-10-CM | POA: Diagnosis not present

## 2022-10-20 DIAGNOSIS — I493 Ventricular premature depolarization: Secondary | ICD-10-CM

## 2022-10-20 DIAGNOSIS — F17200 Nicotine dependence, unspecified, uncomplicated: Secondary | ICD-10-CM

## 2022-10-20 DIAGNOSIS — R0609 Other forms of dyspnea: Secondary | ICD-10-CM

## 2022-10-20 DIAGNOSIS — I519 Heart disease, unspecified: Secondary | ICD-10-CM | POA: Diagnosis not present

## 2022-10-20 MED ORDER — CARVEDILOL 3.125 MG PO TABS: 3.1250 mg | ORAL_TABLET | Freq: Two times a day (BID) | ORAL | 3 refills | Status: DC

## 2022-10-20 NOTE — Telephone Encounter (Signed)
  Patient Consent for Virtual Visit        Christina Sanchez has provided verbal consent on 10/20/2022 for a virtual visit (video or telephone).   CONSENT FOR VIRTUAL VISIT FOR:  Christina Sanchez  By participating in this virtual visit I agree to the following:  I hereby voluntarily request, consent and authorize [facility] and its employed or contracted physicians, physician assistants, nurse practitioners or other licensed health care professionals (the Practitioner), to provide me with telemedicine health care services (the "Services") as deemed necessary by the treating Practitioner. I acknowledge and consent to receive the Services by the Practitioner via telemedicine. I understand that the telemedicine visit will involve communicating with the Practitioner through live audiovisual communication technology and the disclosure of certain medical information by electronic transmission. I acknowledge that I have been given the opportunity to request an in-person assessment or other available alternative prior to the telemedicine visit and am voluntarily participating in the telemedicine visit.  I understand that I have the right to withhold or withdraw my consent to the use of telemedicine in the course of my care at any time, without affecting my right to future care or treatment, and that the Practitioner or I may terminate the telemedicine visit at any time. I understand that I have the right to inspect all information obtained and/or recorded in the course of the telemedicine visit and may receive copies of available information for a reasonable fee.  I understand that some of the potential risks of receiving the Services via telemedicine include:  Delay or interruption in medical evaluation due to technological equipment failure or disruption; Information transmitted may not be sufficient (e.g. poor resolution of images) to allow for appropriate medical decision making by the  Practitioner; and/or  In rare instances, security protocols could fail, causing a breach of personal health information.  Furthermore, I acknowledge that it is my responsibility to provide information about my medical history, conditions and care that is complete and accurate to the best of my ability. I acknowledge that Practitioner's advice, recommendations, and/or decision may be based on factors not within their control, such as incomplete or inaccurate data provided by me or distortions of diagnostic images or specimens that may result from electronic transmissions. I understand that the practice of medicine is not an exact science and that Practitioner makes no warranties or guarantees regarding treatment outcomes. I acknowledge that a copy of this consent can be made available to me via my patient portal [facility] MyChart), or I can request a printed copy by calling the office of [facility].    I understand that my insurance will be billed for this visit.   I have read or had this consent read to me. I understand the contents of this consent, which adequately explains the benefits and risks of the Services being provided via telemedicine.  I have been provided ample opportunity to ask questions regarding this consent and the Services and have had my questions answered to my satisfaction. I give my informed consent for the services to be provided through the use of telemedicine in my medical care

## 2022-10-20 NOTE — Patient Instructions (Signed)
Medication Instructions:   Start taking Carvedilol 3.125 mg twice a day   *If you need a refill on your cardiac medications before your next appointment, please call your pharmacy*   Lab Work: Not needed    Testing/Procedures:  Will be schedule at  [facility] [address]  Your physician has requested that you have an echocardiogram. Echocardiography is a painless test that uses sound waves to create images of your heart. It provides your doctor with information about the size and shape of your heart and how well your heart's chambers and valves are working. This procedure takes approximately one hour. There are no restrictions for this procedure. Please do NOT wear cologne, perfume, aftershave, or lotions (deodorant is allowed). Please arrive 15 minutes prior to your appointment time.    Follow-Up: At [facility], you and your health needs are our priority.  As part of our continuing mission to provide you with exceptional heart care, we have created designated Provider Care Teams.  These Care Teams include your primary Cardiologist (physician) and Advanced Practice Providers (APPs -  Physician Assistants and Nurse Practitioners) who all work together to provide you with the care you need, when you need it.     Your next appointment:   Aug 13.2024      The format for your next appointment:   In Person  Provider:   Micah Flesher, PA-C       Other Instructions

## 2022-10-20 NOTE — Telephone Encounter (Signed)
Called patient. Changed  in-office visit to  virtual visit  per patient request.   Patient verbalized understanding.

## 2022-10-20 NOTE — Progress Notes (Signed)
Virtual Visit via Video Note   Because of Christina Sanchez's co-morbid illnesses, she is at least at moderate risk for complications without adequate follow up.  This format is felt to be most appropriate for this patient at this time.  All issues noted in this document were discussed and addressed.  A limited physical exam was performed with this format.  Please refer to the patient's chart for her consent to telehealth for [facility].  Video Connection Lost Video connection was lost at > 50% of the duration of this visit, at which time the remainder of the visit was completed via audio only.     Patient has given verbal permission to conduct this visit via virtual appointment and to bill insurance 10/20/2022 9:03 AM     Evaluation Performed:  Follow-up visit  Patient Location: Home Provider Location: Office/Clinic  Date:  10/23/2022  ID:  Christina Sanchez, DOB [birth year], MRN [id number] PCP: Sharmon Revere, MD  [facility] Providers Cardiologist:  Bryan Lemma, MD     Chief Complaint  Patient presents with   Fatigue   Follow-up    Test results    History of Present Illness: .     Christina Sanchez is a 72 y.o. 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  She was originally referred at the request of Sharmon Revere, MD.  Christina Sanchez was last seen on 09/13/2022 for 88-month follow-up after initial consultation on 07/14/2022.  She complained of several episodes of heart fluttering and beating fast.  Episodes occurring at least twice a week lasting up to 5 minutes.  Also noted occasional dyspnea but no chest pain with activity.  She may note some chest tightness with overexertion associated with dyspnea.  Can sometimes go down the left arm.   BP pretty well-controlled.  No CHF symptoms of PND, orthopnea or edema.  No recollection of a diagnosis of A-fib. Concern with exertional dyspnea with chest discomfort-Myoview ordered-results reviewed below.    Subjective  Christina Sanchez is being seen today via telehealth to discuss results of her stress test.  She still notes having exertional dyspnea going up stairs or walking close.  She just says she has to pace herself.  She has not had any chest pain or pressure with rest or exertion.  This is no new exertional dyspnea. As far as palpitations she did an episode about 4 days ago that lasted for 5 minutes or so but not prolonged.  This is the most recent episode she had not and for the most part they have not been all that significant.  The only chest discomfort she has had is pain in the upper left breast which has been there ever since her breast surgery.  It is more associated with movements and deep inspiration more musculoskeletal in nature.  Not a new symptom and not associate with exertion.  ROS:  Cardiovascular ROS: positive for - chest pain, dyspnea on exertion, and palpitations negative for - edema, irregular heartbeat, orthopnea, paroxysmal nocturnal dyspnea, rapid heart rate, shortness of breath, or syncope or near syncope, TIA/amaurosis fugax or claudication  Review of Systems - Negative except symptoms noted below Respiratory ROS: positive for - exertional dyspnea but also some wheezing and coughing Musculoskeletal ROS: positive for - joint  pain, joint stiffness, and musculoskeletal chest pain    Objective  Studies Reviewed: .       Echo 08/03/2021 ([facility]): EF 60 to 65%.  No RWMA.  GR 1 DD.  Normal RV.  Mild MR.  Normal RAP and RVP. Echo 07/14/2022 [facility]): EF 60 to 65%.  Normal LV size and function.  Mild LVH.  GR 1 DD.  Normal RV size and function.  Borderline aortic root-3.9 cm.  14-day Zio patch monitor (2/14-28/2024) (ordered by [facility]): Sinus rhythm, rate range 55 to 141 bpm.  AVG 75 bpm.  Occasional (1.8%) PVCs with rare couplets-symptomatic..  Some bigeminy and trigeminy.  Rare (<1%) isolated PACs couplets and triplets.  9 short atrial runs: Fastest-11 beats (4.3 sec), rate 133-164 bpm - Avg 155 BPM; longest 11 beats (6.5 sec), rate 72-133 bpm, Avg 110 bpm.  Lexiscan Myoview 09/29/2022: INTERMEDIATE RISK (due to reduced systolic function and prior infarct).  Fixed apical inferior perfusion defect with abnormal wall motion consistent with infarct.  EF 30 to 44% with apical inferior hypokinesis.  NO ISCHEMIA.  Unable to walk therefore unable to assess the effect of exercise on PVCs.  Risk Assessment/Calculations:     BP is elevated today.  On recheck was a little bit better 156/105.      Physical Exam:   VS:  BP (!) 156/105   Ht 5\' 6"  (1.676 m)   Wt 138 lb (62.6 kg)   BMI 22.27 kg/m   => follow-up BP 156/105 Wt Readings from Last 3 Encounters:  10/20/22 138 lb (62.6 kg)  09/13/22 138 lb 12.8 oz (63 kg)  07/30/21 134 lb (60.8 kg)    GEN: Healthy appearing, no acute distress;  Respiratory, non labored Neuropsych: Normal    ASSESSMENT AND PLAN: .    Problem List Items Addressed This Visit       Cardiology Problems   Symptomatic PVCs (Chronic)    Symptomatic PVCs with exertional dyspnea going up steps.  No chest pain. Interestingly, Myoview now shows fixed apical inferior perfusion defect but no evidence of ischemia.  He says the EF is down.  Question if this is result of the PVCs or potentially cause.  The reduced EF and inferior hypokinesis is new compared to the echo in April.  We discussed potential options: We have a couple of options on what to do next - best discussed in a follow-up visit: a) could recheck Echocardiogram to see if this "old heart attack" has been since the Echo done in April,; b) check a coronary artery CT angiogram as a non-invasive way to check for artery blockages; c) consider  cardiac catheterization to look at heart arteries & pump function & potentially treat a blockage if their)   Plan: After discussion, without her having any active chest pain or pressure or real heart failure symptoms, I think probably the best course of action will be to reassess to see if this indeed is a new wall motion normality with an echocardiogram.  If that is abnormal and there is evidence of a new wall motion normality, I think the best option would be to proceed with full ischemic evaluation with either Cardiac Catheterization or Coronary CTA. If there is indeed a wall motion abnormality with decreased EF would lean more toward cardiac catheterization, however, if no wall motion abnormality noted would would then proceed with Coronary CTA Start carvedilol 3.125 mg twice daily      Relevant Medications   carvedilol (  COREG) 3.125 MG tablet   Other Relevant Orders   ECHOCARDIOGRAM COMPLETE   Hyperlipidemia (Chronic)    Last LDL was 86 with atorvastatin 10 mg daily.  Low threshold to increase dose and target LDL less than 70.      Relevant Medications   carvedilol (COREG) 3.125 MG tablet   Essential hypertension (Chronic)    BP is way high today.  Had been pretty well-controlled previously.  I recommend that she take an extra half dose of her BP med today and reassess.  Will need to reassess closely and follow-up.  Reassess blood pressure in follow-up visit.  Need to check her home BP cuff.  We are adding carvedilol 3.125 mg twice daily.      Relevant Medications   carvedilol (COREG) 3.125 MG tablet   Diastolic dysfunction, left ventricle (Chronic)   Relevant Medications   carvedilol (COREG) 3.125 MG tablet   Other Relevant Orders   ECHOCARDIOGRAM COMPLETE     Other   DOE (dyspnea on exertion)    Interestingly, her previous echo was normal, now we have PVCs that were symptomatic on monitor and echocardiogram showing reduced EF and new wall motion normality.  Plan: Recheck 2D  Echo, pending results consider either cardiac catheterization or coronary CTA.  Discussion can be had in follow-up visit with APP after Echo.      Relevant Orders   ECHOCARDIOGRAM COMPLETE   Current smoker (Chronic)    Not yet ready to quit.  Cessation counseling provided.      Abnormal myocardial perfusion study - Primary    Very unusual to new wall motion normality is seen in the area of the grossly suspected infarction but no ischemia.  If this is not the new then I will expect that what ever event that was that happened since April.  For now we will reassess with a 2D echo to get a better sense of wall motion, and if there is a new wall motion abnormality I would be in favor of invasive cardiac catheterization, however if there is no wall motion normality I still think we need to delineate the stress test results better with the Coronary CTA.      Relevant Orders   ECHOCARDIOGRAM COMPLETE        Informed Consent   Shared Decision Making/Informed Consent{ Pending results of follow-up Echo: The risks [stroke (1 in 1000), death (1 in 1000), kidney failure [usually temporary] (1 in 500), bleeding (1 in 200), allergic reaction [possibly serious] (1 in 200)], benefits (diagnostic support and management of coronary artery disease) and alternatives of a cardiac catheterization were discussed in detail with Christina Sanchez and she is willing to proceed.      Dispo: Return in about 1 month (around 11/23/2022) for Routine Follow-up after testing ~ 1-2 months, Follow-up with APP.  Total time spent: 26 min spent with patient + 25 min spent charting = 51 min     Signed, Marykay Lex, MD, MS Bryan Lemma, M.D., M.S. Interventional Cardiologist  [facility]  Pager # [phone] Phone # [phone] [address] [place], [place] [address]

## 2022-10-21 ENCOUNTER — Other Ambulatory Visit (HOSPITAL_COMMUNITY): Payer: Medicare Other

## 2022-10-23 ENCOUNTER — Encounter: Payer: Self-pay | Admitting: Cardiology

## 2022-10-23 NOTE — Assessment & Plan Note (Signed)
Symptomatic PVCs with exertional dyspnea going up steps.  No chest pain. Interestingly, Myoview now shows fixed apical inferior perfusion defect but no evidence of ischemia.  He says the EF is down.  Question if this is result of the PVCs or potentially cause.  The reduced EF and inferior hypokinesis is new compared to the echo in April.  We discussed potential options: We have a couple of options on what to do next - best discussed in a follow-up visit: a) could recheck Echocardiogram to see if this "old heart attack" has been since the Echo done in April,; b) check a coronary artery CT angiogram as a non-invasive way to check for artery blockages; c) consider cardiac catheterization to look at heart arteries & pump function & potentially treat a blockage if their)   Plan: After discussion, without her having any active chest pain or pressure or real heart failure symptoms, I think probably the best course of action will be to reassess to see if this indeed is a new wall motion normality with an echocardiogram.  If that is abnormal and there is evidence of a new wall motion normality, I think the best option would be to proceed with full ischemic evaluation with either Cardiac Catheterization or Coronary CTA. If there is indeed a wall motion abnormality with decreased EF would lean more toward cardiac catheterization, however, if no wall motion abnormality noted would would then proceed with Coronary CTA Start carvedilol 3.125 mg twice daily

## 2022-10-23 NOTE — Assessment & Plan Note (Signed)
Interestingly, her previous echo was normal, now we have PVCs that were symptomatic on monitor and echocardiogram showing reduced EF and new wall motion normality.  Plan: Recheck 2D Echo, pending results consider either cardiac catheterization or coronary CTA.  Discussion can be had in follow-up visit with APP after Echo.

## 2022-10-23 NOTE — Assessment & Plan Note (Signed)
Very unusual to new wall motion normality is seen in the area of the grossly suspected infarction but no ischemia.  If this is not the new then I will expect that what ever event that was that happened since April.  For now we will reassess with a 2D echo to get a better sense of wall motion, and if there is a new wall motion abnormality I would be in favor of invasive cardiac catheterization, however if there is no wall motion normality I still think we need to delineate the stress test results better with the Coronary CTA.

## 2022-10-23 NOTE — Assessment & Plan Note (Signed)
Last LDL was 86 with atorvastatin 10 mg daily.  Low threshold to increase dose and target LDL less than 70.

## 2022-10-23 NOTE — Assessment & Plan Note (Signed)
Not yet ready to quit.  Cessation counseling provided.

## 2022-10-23 NOTE — Assessment & Plan Note (Addendum)
BP is way high today.  Had been pretty well-controlled previously.  I recommend that she take an extra half dose of her BP med today and reassess.  Will need to reassess closely and follow-up.  Reassess blood pressure in follow-up visit.  Need to check her home BP cuff.  We are adding carvedilol 3.125 mg twice daily.

## 2022-11-11 ENCOUNTER — Ambulatory Visit (HOSPITAL_COMMUNITY): Payer: Medicare Other

## 2022-11-14 NOTE — Progress Notes (Deleted)
Cardiology Office Note:    Date:  11/14/2022   ID:  [name], DOB [birth year], MRN [id number]  PCP:  Christina Revere, MD   [facility] Providers Cardiologist:  Bryan Lemma, MD { Click to update primary MD,subspecialty MD or APP then REFRESH:1}    Referring MD: Christina Revere, MD   No chief complaint on file. ***  History of Present Illness:    Christina Sanchez is a 72 y.o. female with a hx of hypertension, question hypertensive heart disease, symptomatic PVCs, hyperlipidemia, DM 2 with neuropathy and nephropathy, COPD, emphysema, and reported history of PAF.  She was referred to cardiology for evaluation of palpitations.  She also reported exertional dyspnea and chest discomfort. I do not see recent heart monitor.   An echocardiogram 07/2022 showed an LV EF 60 to 65%, grade 1 DD, and no significant valvular disease.  She underwent nuclear stress test that now showed fixed apical inferior perfusion defect but no evidence of ischemia.  Through shared decision-making with the patient, decision was made to proceed with repeat echocardiogram to evaluate wall motion abnormality.  If echocardiogram was abnormal, plan for ischemic evaluation with CT coronary versus definitive angiography.     Abnormal nuclear stress test -Awaiting repeat echocardiogram - If there is wall motion abnormality with a reduced EF, would then schedule cardiac catheterization.  However if no wall motion abnormality noted then we will proceed with coronary CTA - Continue carvedilol 3.125 mg twice daily -?  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   Current every day smoker    Started smoking at age 36 (24.5 pack smoking history.  On going 1/2 PPD   Hyperlipidemia    Hypertension    MDD (major depressive disorder)    Without psychotic features.  Pending behavioral health evaluation.   Personal history of radiation therapy     Past Surgical History:  Procedure Laterality Date   BREAST LUMPECTOMY     BREAST LUMPECTOMY WITH RADIOACTIVE SEED AND SENTINEL LYMPH NODE BIOPSY Left 06/30/2021   Procedure: LEFT BREAST LUMPECTOMY WITH RADIOACTIVE SEED AND SENTINEL LYMPH NODE BIOPSY;  Surgeon: Harriette Bouillon, MD;  Location: [facility];  Service: General;  Laterality: Left;   fistula track removal     rectal   FOOT SURGERY     Lower Extremity ABIs  09/03/2021   Resting left and right ABI and TBI within normal range.  No evidence of bilateral lower extremity arterial disease.   NM MYOVIEW LTD  09/29/2022   Lexiscan - > INTERMEDIATE RISK (due to reduced systolic function and prior infarct).  Fixed apical inferior perfusion defect with abnormal wall motion consistent with infarct.  EF 30 to 44% with apical inferior hypokinesis.  NO ISCHEMIA.  Unable to walk therefore unable to assess the effect of exercise on PVCs.   PARTIAL HYSTERECTOMY     TRANSTHORACIC ECHOCARDIOGRAM  08/03/2021   Normal LV size and function.  EF 60 to 65%.  No RWMA.  GR 1 DD/impaired relaxation (normal for age).  Normal RV size and function.  Normal PAP.  Normal RAP.  Normal MV.  AoV sclerosis but no stenosis.   TRANSTHORACIC ECHOCARDIOGRAM  07/14/2022   [facility]): Normal LV size and function.  EF 60 to 65%.  No RWMA.  Mild LVH.  G1 DD.  Normal RV.  Essentially normal valves.  Normal RV.  Normal RAP.   ZIO PATCH MONITOR   05/2022   (2/14-28/2024) (ordered by [facility]): Sinus rhythm, rate range 55 to 141 bpm.  AVG 75 bpm.  Occasional (1.8%) PVCs with rare couplets-symptomatic..  Some bigeminy and trigeminy.  Rare (<1%) isolated PACs couplets and triplets.  9 short atrial runs: Fastest-11 beats (4.3 sec), rate 133-164 bpm - Avg 155 BPM; longest 11 beats (6.5 sec), rate 72-133 bpm, Avg 110 bpm.    Current Medications: No outpatient medications have been marked as taking for the 11/23/22 encounter (Appointment) with Marcelino Duster, PA.     Allergies:   Aspirin, Latex, and Lexapro [escitalopram]   Social History   Socioeconomic History   Marital status: Single    Spouse name: Not on file   Number of children: 1   Years of education: Not on file   Highest education level: High school graduate  Occupational History   Not on file  Tobacco Use   Smoking status: Every Day    Current packs/day: 0.50    Average packs/day: 0.5 packs/day for 50.0 years (25.0 ttl pk-yrs)    Types: Cigarettes   Smokeless tobacco: Never   Tobacco comments:    8-9 cigarattes/day  Vaping Use   Vaping status: Never Used  Substance and Sexual Activity   Alcohol use: Yes    Alcohol/week: 15.0 standard drinks of alcohol    Types: 15 Glasses of wine per week    Comment: occ wine   Drug use: No   Sexual activity: Not on file  Other Topics Concern   Not on file  Social History Narrative   Lives at home with her son   Right handed   Caffeine: 1 cup of tea daily   Social Determinants of Health   Financial Resource Strain: Not on file  Food Insecurity: Not on file  Transportation Needs: Unmet Transportation Needs (09/25/2021)   PRAPARE - Administrator, Civil Service (Medical): Not on file    Lack of Transportation (Non-Medical): Yes  Physical Activity: Not on file  Stress: Not on file  Social Connections: Unknown (08/25/2021)   Received from [facility]   Social Network    Social Network: Not on  file     Family History: The patient's ***family history includes Diabetes in her brother. There is no history of Migraines, Headache, or Breast cancer.  ROS:   Please see the history of present illness.    *** All other systems reviewed and are negative.  EKGs/Labs/Other Studies Reviewed:    The following studies were reviewed today: ***      Recent Labs: No results found for requested labs within last 365 days.  Recent Lipid Panel No results found for: "CHOL", "TRIG", "HDL", "CHOLHDL", "VLDL", "LDLCALC", "LDLDIRECT"   Risk Assessment/Calculations:   {Does this patient have ATRIAL FIBRILLATION?:[phone]}  No BP recorded.  {Refresh Note OR Click here to enter BP  :1}***         Physical Exam:  VS:  There were no vitals taken for this visit.    Wt Readings from Last 3 Encounters:  10/20/22 138 lb (62.6 kg)  09/13/22 138 lb 12.8 oz (63 kg)  07/30/21 134 lb (60.8 kg)     GEN: *** Well nourished, well developed in no acute distress HEENT: Normal NECK: No JVD; No carotid bruits LYMPHATICS: No lymphadenopathy CARDIAC: ***RRR, no murmurs, rubs, gallops RESPIRATORY:  Clear to auscultation without rales, wheezing or rhonchi  ABDOMEN: Soft, non-tender, non-distended MUSCULOSKELETAL:  No edema; No deformity  SKIN: Warm and dry NEUROLOGIC:  Alert and oriented x 3 PSYCHIATRIC:  Normal affect   ASSESSMENT:    No diagnosis found. PLAN:    In order of problems listed above:  ***      {Are you ordering a CV Procedure (e.g. stress test, cath, DCCV, TEE, etc)?   Press F2        :[id number]}    Medication Adjustments/Labs and Tests Ordered: Current medicines are reviewed at length with the patient today.  Concerns regarding medicines are outlined above.  No orders of the defined types were placed in this encounter.  No orders of the defined types were placed in this encounter.   There are no Patient Instructions on file for this visit.   Signed, Marcelino Duster, [place]  11/14/2022 4:50 PM    [facility]

## 2022-11-23 ENCOUNTER — Encounter: Payer: Self-pay | Admitting: Physician Assistant

## 2022-11-23 ENCOUNTER — Ambulatory Visit: Payer: Medicare Other | Attending: Cardiology | Admitting: Physician Assistant

## 2022-11-26 ENCOUNTER — Inpatient Hospital Stay: Payer: Medicare Other | Attending: Radiation Oncology | Admitting: Licensed Clinical Social Worker

## 2022-11-26 DIAGNOSIS — C50412 Malignant neoplasm of upper-outer quadrant of left female breast: Secondary | ICD-10-CM

## 2022-11-26 NOTE — Progress Notes (Signed)
[facility] CSW Progress Note  Clinical Child psychotherapist  returned call to pt.  Pt requesting assistance with navigating medical bills she is unable to pay.  CSW suggested pt contact billing to apply for a [employer] or payment plan.  Pt also informed CSW she is experiencing increased pain at the site of radiation that is now extending under her arm as well and does not know who she should follow up with.  Message sent to radiation to provide direction for pt.  CSW to remain available as appropriate.        Rachel Moulds, LCSW Clinical Social Worker [facility]

## 2022-12-08 ENCOUNTER — Ambulatory Visit (HOSPITAL_COMMUNITY): Payer: Medicare Other | Attending: Cardiology

## 2022-12-08 DIAGNOSIS — R9439 Abnormal result of other cardiovascular function study: Secondary | ICD-10-CM | POA: Diagnosis not present

## 2022-12-08 DIAGNOSIS — I519 Heart disease, unspecified: Secondary | ICD-10-CM | POA: Diagnosis present

## 2022-12-08 DIAGNOSIS — I493 Ventricular premature depolarization: Secondary | ICD-10-CM | POA: Diagnosis present

## 2022-12-08 DIAGNOSIS — R0609 Other forms of dyspnea: Secondary | ICD-10-CM | POA: Insufficient documentation

## 2022-12-08 LAB — ECHOCARDIOGRAM COMPLETE
Area-P 1/2: 4.63 cm2
Calc EF: 52.1 %
MV M vel: 5.81 m/s
MV Peak grad: 135 mmHg
S' Lateral: 3.7 cm
Single Plane A2C EF: 48.8 %
Single Plane A4C EF: 54.4 %

## 2023-04-12 ENCOUNTER — Other Ambulatory Visit: Payer: Self-pay | Admitting: Family Medicine

## 2023-04-12 DIAGNOSIS — Z9889 Other specified postprocedural states: Secondary | ICD-10-CM

## 2023-04-12 DIAGNOSIS — Z122 Encounter for screening for malignant neoplasm of respiratory organs: Secondary | ICD-10-CM

## 2023-04-18 ENCOUNTER — Other Ambulatory Visit: Payer: Self-pay | Admitting: Family Medicine

## 2023-04-18 DIAGNOSIS — F17200 Nicotine dependence, unspecified, uncomplicated: Secondary | ICD-10-CM

## 2023-04-20 ENCOUNTER — Encounter: Payer: Self-pay | Admitting: Family Medicine

## 2023-04-25 ENCOUNTER — Encounter: Payer: Self-pay | Admitting: Family Medicine

## 2023-04-26 ENCOUNTER — Other Ambulatory Visit: Payer: Self-pay | Admitting: Family Medicine

## 2023-04-26 DIAGNOSIS — R319 Hematuria, unspecified: Secondary | ICD-10-CM

## 2023-04-27 ENCOUNTER — Other Ambulatory Visit: Payer: Medicare Other

## 2023-05-11 ENCOUNTER — Other Ambulatory Visit: Payer: Medicare Other

## 2023-05-11 ENCOUNTER — Other Ambulatory Visit: Payer: Medicare (Managed Care)

## 2023-06-03 ENCOUNTER — Other Ambulatory Visit: Payer: Medicare (Managed Care)

## 2023-06-06 ENCOUNTER — Other Ambulatory Visit: Payer: Medicare (Managed Care)

## 2023-06-12 ENCOUNTER — Emergency Department (HOSPITAL_COMMUNITY)

## 2023-06-12 ENCOUNTER — Encounter (HOSPITAL_COMMUNITY): Payer: Self-pay | Admitting: Emergency Medicine

## 2023-06-12 ENCOUNTER — Other Ambulatory Visit: Payer: Self-pay

## 2023-06-12 ENCOUNTER — Observation Stay (HOSPITAL_COMMUNITY)
Admission: EM | Admit: 2023-06-12 | Discharge: 2023-06-15 | Disposition: A | Discharge status: Home / Self Care | Attending: Emergency Medicine | Admitting: Emergency Medicine

## 2023-06-12 DIAGNOSIS — I10 Essential (primary) hypertension: Secondary | ICD-10-CM | POA: Diagnosis present

## 2023-06-12 DIAGNOSIS — Z853 Personal history of malignant neoplasm of breast: Secondary | ICD-10-CM | POA: Insufficient documentation

## 2023-06-12 DIAGNOSIS — E876 Hypokalemia: Secondary | ICD-10-CM | POA: Diagnosis not present

## 2023-06-12 DIAGNOSIS — I110 Hypertensive heart disease with heart failure: Secondary | ICD-10-CM | POA: Insufficient documentation

## 2023-06-12 DIAGNOSIS — R0602 Shortness of breath: Secondary | ICD-10-CM | POA: Diagnosis present

## 2023-06-12 DIAGNOSIS — E785 Hyperlipidemia, unspecified: Secondary | ICD-10-CM | POA: Insufficient documentation

## 2023-06-12 DIAGNOSIS — Z79899 Other long term (current) drug therapy: Secondary | ICD-10-CM | POA: Insufficient documentation

## 2023-06-12 DIAGNOSIS — I493 Ventricular premature depolarization: Secondary | ICD-10-CM | POA: Diagnosis present

## 2023-06-12 DIAGNOSIS — F17210 Nicotine dependence, cigarettes, uncomplicated: Secondary | ICD-10-CM | POA: Insufficient documentation

## 2023-06-12 DIAGNOSIS — I2081 Angina pectoris with coronary microvascular dysfunction: Secondary | ICD-10-CM | POA: Diagnosis not present

## 2023-06-12 DIAGNOSIS — C50412 Malignant neoplasm of upper-outer quadrant of left female breast: Secondary | ICD-10-CM

## 2023-06-12 DIAGNOSIS — I130 Hypertensive heart and chronic kidney disease with heart failure and stage 1 through stage 4 chronic kidney disease, or unspecified chronic kidney disease: Secondary | ICD-10-CM | POA: Diagnosis not present

## 2023-06-12 DIAGNOSIS — D7589 Other specified diseases of blood and blood-forming organs: Secondary | ICD-10-CM | POA: Insufficient documentation

## 2023-06-12 DIAGNOSIS — Z91040 Latex allergy status: Secondary | ICD-10-CM | POA: Insufficient documentation

## 2023-06-12 DIAGNOSIS — I5032 Chronic diastolic (congestive) heart failure: Secondary | ICD-10-CM | POA: Diagnosis not present

## 2023-06-12 DIAGNOSIS — I2089 Other forms of angina pectoris: Secondary | ICD-10-CM

## 2023-06-12 DIAGNOSIS — J439 Emphysema, unspecified: Secondary | ICD-10-CM | POA: Diagnosis not present

## 2023-06-12 DIAGNOSIS — R079 Chest pain, unspecified: Principal | ICD-10-CM | POA: Diagnosis present

## 2023-06-12 DIAGNOSIS — E871 Hypo-osmolality and hyponatremia: Secondary | ICD-10-CM | POA: Insufficient documentation

## 2023-06-12 DIAGNOSIS — R0989 Other specified symptoms and signs involving the circulatory and respiratory systems: Secondary | ICD-10-CM

## 2023-06-12 DIAGNOSIS — Z1152 Encounter for screening for COVID-19: Secondary | ICD-10-CM | POA: Diagnosis not present

## 2023-06-12 DIAGNOSIS — F17200 Nicotine dependence, unspecified, uncomplicated: Secondary | ICD-10-CM | POA: Diagnosis present

## 2023-06-12 LAB — BASIC METABOLIC PANEL
Anion gap: 15 (ref 5–15)
BUN: <5 mg/dL — ABNORMAL LOW (ref 8–23)
CO2: 23 mmol/L (ref 22–32)
Calcium: 9.4 mg/dL (ref 8.9–10.3)
Chloride: 93 mmol/L — ABNORMAL LOW (ref 98–111)
Creatinine, Ser: 0.9 mg/dL (ref 0.44–1.00)
GFR, Estimated: >60 mL/min (ref >60)
Glucose, Bld: 114 mg/dL — ABNORMAL HIGH (ref 70–99)
Potassium: 3.4 mmol/L — ABNORMAL LOW (ref 3.5–5.1)
Sodium: 131 mmol/L — ABNORMAL LOW (ref 135–145)

## 2023-06-12 LAB — HEPATIC FUNCTION PANEL
ALT: 28 U/L (ref 0–44)
AST: 34 U/L (ref 15–41)
Albumin: 3.6 g/dL (ref 3.5–5.0)
Alkaline Phosphatase: 59 U/L (ref 38–126)
Bilirubin, Direct: 0.2 mg/dL (ref 0.0–0.2)
Indirect Bilirubin: 0.8 mg/dL (ref 0.3–0.9)
Total Bilirubin: 1 mg/dL (ref 0.0–1.2)
Total Protein: 6.5 g/dL (ref 6.5–8.1)

## 2023-06-12 LAB — CBC
HCT: 37.1 % (ref 36.0–46.0)
Hemoglobin: 13 g/dL (ref 12.0–15.0)
MCH: 36.1 pg — ABNORMAL HIGH (ref 26.0–34.0)
MCHC: 35 g/dL (ref 30.0–36.0)
MCV: 103.1 fL — ABNORMAL HIGH (ref 80.0–100.0)
Platelets: 237 10*3/uL (ref 150–400)
RBC: 3.6 MIL/uL — ABNORMAL LOW (ref 3.87–5.11)
RDW: 12.5 % (ref 11.5–15.5)
WBC: 5.9 10*3/uL (ref 4.0–10.5)
nRBC: 0 % (ref 0.0–0.2)

## 2023-06-12 LAB — TROPONIN I (HIGH SENSITIVITY)
Troponin I (High Sensitivity): 8 ng/L (ref <18)
Troponin I (High Sensitivity): 7 ng/L (ref <18)

## 2023-06-12 LAB — LIPASE, BLOOD: Lipase: 24 U/L (ref 11–51)

## 2023-06-12 LAB — RESP PANEL BY RT-PCR (RSV, FLU A&B, COVID)  RVPGX2
Influenza A by PCR: NEGATIVE
Influenza B by PCR: NEGATIVE
Resp Syncytial Virus by PCR: NEGATIVE
SARS Coronavirus 2 by RT PCR: NEGATIVE

## 2023-06-12 LAB — GROUP A STREP BY PCR: Group A Strep by PCR: NOT DETECTED

## 2023-06-12 MED ORDER — IOHEXOL 350 MG/ML SOLN
75.0000 mL | Freq: Once | INTRAVENOUS | Status: AC | PRN
  Administered 2023-06-12: 75 mL via INTRAVENOUS

## 2023-06-12 NOTE — ED Provider Notes (Signed)
 [place] EMERGENCY DEPARTMENT AT [facility] Provider Note   CSN: [id number] Arrival date & time: 06/12/23  2050     History {Add pertinent medical, surgical, social history, OB history to HPI:1} Chief Complaint  Patient presents with   Chest Pain   Shortness of Breath    Christina Sanchez is a 73 y.o. female.  The history is provided by the patient and medical records.  Chest Pain Associated symptoms: shortness of breath   Shortness of Breath Associated symptoms: chest pain    73 y.o. F with hx of HTN, HLP, emphysema, history of breast cancer status post completion of treatment 2023, presenting to the ED for chest pain and SOB.  States for the past week she has been feeling poorly--fatigued. Low appetite, nausea, and lightheadedness with standing and palpitations.  States at times she feels like she is going to pass out but she has never lost consciousness.  She has not had vomiting/diarrhea.  No fever/chills.  No sick contacts.  States she has been having some hiccups and sore throat as well.  States her "tongue" feels sore on the sides.  She denies any oral lesions.  She was worried she may have strep throat as she has had this previously.  She was given nitro with EMS which dropped her blood pressure.  She has a allergy to aspirin.  Home Medications Prior to Admission medications   Medication Sig Start Date End Date Taking? Authorizing Provider  albuterol (VENTOLIN HFA) 108 (90 Base) MCG/ACT inhaler Inhale 2 puffs into the lungs as needed. 05/27/21   [provider]  amLODipine-benazepril (LOTREL) 5-10 MG capsule Take 1 capsule by mouth daily. 12/02/18   [provider]  atorvastatin (LIPITOR) 10 MG tablet Take 10 mg by mouth every morning. 05/27/21   [provider]  carvedilol (COREG) 3.125 MG tablet Take 1 tablet (3.125 mg total) by mouth 2 (two) times daily. 10/20/22 01/18/23  Marykay Lex, MD  Cyanocobalamin (B-12) 1000 MCG CAPS Take 1  capsule by mouth daily. Patient not taking: Reported on 10/20/2022 04/27/21   [provider]  diclofenac Sodium (VOLTAREN) 1 % GEL Apply topically as needed. Patient not taking: Reported on 10/20/2022    [provider]  fluticasone (FLONASE) 50 MCG/ACT nasal spray Place 2 sprays into both nostrils as needed.    [provider]  mirtazapine (REMERON) 15 MG tablet Take 15 mg by mouth at bedtime.    [provider]  naproxen (NAPROSYN) 500 MG tablet Take 500 mg by mouth every other day. 10/01/22   [provider]  zolpidem (AMBIEN) 5 MG tablet Take 5 mg by mouth at bedtime as needed.    [provider]      Allergies    Aspirin, Latex, and Lexapro [escitalopram]    Review of Systems   Review of Systems  Respiratory:  Positive for shortness of breath.   Cardiovascular:  Positive for chest pain.  Neurological:  Positive for light-headedness.  All other systems reviewed and are negative.   Physical Exam Updated Vital Signs BP 136/88 (BP Location: Right Arm)   Pulse 95   Temp 98 F (36.7 C) (Oral)   Resp 16   Ht 5\' 6"  (1.676 m)   Wt 59 kg   SpO2 95%   BMI 20.98 kg/m   Physical Exam Vitals and nursing note reviewed.  Constitutional:      Appearance: She is well-developed.     Comments: Elderly, seems a  bit weak  HENT:     Head: Normocephalic and atraumatic.     Mouth/Throat:     Comments: Slight erythema posterior oropharynx, no lesions noted to the tongue Eyes:     Conjunctiva/sclera: Conjunctivae normal.     Pupils: Pupils are equal, round, and reactive to light.  Cardiovascular:     Rate and Rhythm: Normal rate and regular rhythm.     Heart sounds: Normal heart sounds.  Pulmonary:     Effort: Pulmonary effort is normal.     Breath sounds: Normal breath sounds.  Abdominal:     General: Bowel sounds are normal.     Palpations: Abdomen is soft.  Musculoskeletal:        General: Normal range of motion.     Cervical  back: Normal range of motion.  Skin:    General: Skin is warm and dry.  Neurological:     Mental Status: She is alert and oriented to person, place, and time.     ED Results / Procedures / Treatments   Labs (all labs ordered are listed, but only abnormal results are displayed) Labs Reviewed  BASIC METABOLIC PANEL - Abnormal; Notable for the following components:      Result Value   Sodium 131 (*)    Potassium 3.4 (*)    Chloride 93 (*)    Glucose, Bld 114 (*)    BUN <5 (*)    All other components within normal limits  CBC - Abnormal; Notable for the following components:   RBC 3.60 (*)    MCV 103.1 (*)    MCH 36.1 (*)    All other components within normal limits  RESP PANEL BY RT-PCR (RSV, FLU A&B, COVID)  RVPGX2  GROUP A STREP BY PCR  HEPATIC FUNCTION PANEL  LIPASE, BLOOD  TROPONIN I (HIGH SENSITIVITY)    EKG None  Radiology DG Chest 2 View Result Date: 06/12/2023 CLINICAL DATA:  Left-sided chest pain and shortness of breath. EXAM: CHEST - 2 VIEW COMPARISON:  None Available. FINDINGS: The heart size and mediastinal contours are within normal limits. There is moderate severity calcification of the aortic arch and tortuosity of the descending thoracic aorta. Both lungs are clear. Radiopaque surgical clips are seen within the soft tissues of the left breast. The visualized skeletal structures are unremarkable. IMPRESSION: No active cardiopulmonary disease. Electronically Signed   By: Aram Candela M.D.   On: 06/12/2023 22:07    Procedures Procedures  {Document cardiac monitor, telemetry assessment procedure when appropriate:1}  Medications Ordered in ED Medications - No data to display  ED Course/ Medical Decision Making/ A&P   {   Click here for ABCD2, HEART and other calculatorsREFRESH Note before signing :1}                              Medical Decision Making Amount and/or Complexity of Data Reviewed Labs: ordered. Radiology: ordered.   ***  {Document  critical care time when appropriate:1} {Document review of labs and clinical decision tools ie heart score, Chads2Vasc2 etc:1}  {Document your independent review of radiology images, and any outside records:1} {Document your discussion with family members, caretakers, and with consultants:1} {Document social determinants of health affecting pt's care:1} {Document your decision making why or why not admission, treatments were needed:1} Final Clinical Impression(s) / ED Diagnoses Final diagnoses:  None    Rx / DC Orders ED Discharge Orders     None

## 2023-06-12 NOTE — ED Triage Notes (Signed)
 Pt BIB EMS from home for L sided CP and ShOB that started last night. 1 nitroglycerin given by EMS with a 60 point drop in BP. Reports allergy to ASA. C/o sore throat, "feels hot between her legs", generalized weakness/fatigue, nausea, palpitations and dizziness x 1 week. Poor PO intake x 3 days.

## 2023-06-13 ENCOUNTER — Encounter (HOSPITAL_COMMUNITY): Payer: Self-pay | Admitting: Internal Medicine

## 2023-06-13 DIAGNOSIS — J439 Emphysema, unspecified: Secondary | ICD-10-CM

## 2023-06-13 DIAGNOSIS — R0989 Other specified symptoms and signs involving the circulatory and respiratory systems: Secondary | ICD-10-CM | POA: Diagnosis not present

## 2023-06-13 DIAGNOSIS — E785 Hyperlipidemia, unspecified: Secondary | ICD-10-CM | POA: Diagnosis not present

## 2023-06-13 DIAGNOSIS — I10 Essential (primary) hypertension: Secondary | ICD-10-CM | POA: Diagnosis not present

## 2023-06-13 DIAGNOSIS — E876 Hypokalemia: Secondary | ICD-10-CM | POA: Diagnosis not present

## 2023-06-13 DIAGNOSIS — R079 Chest pain, unspecified: Secondary | ICD-10-CM

## 2023-06-13 DIAGNOSIS — E871 Hypo-osmolality and hyponatremia: Secondary | ICD-10-CM | POA: Insufficient documentation

## 2023-06-13 DIAGNOSIS — D7589 Other specified diseases of blood and blood-forming organs: Secondary | ICD-10-CM | POA: Insufficient documentation

## 2023-06-13 LAB — LIPID PANEL
Cholesterol: 159 mg/dL (ref 0–200)
HDL: 70 mg/dL (ref >40)
LDL Cholesterol: 74 mg/dL (ref 0–99)
Total CHOL/HDL Ratio: 2.3 ratio
Triglycerides: 73 mg/dL (ref <150)
VLDL: 15 mg/dL (ref 0–40)

## 2023-06-13 LAB — URINALYSIS, ROUTINE W REFLEX MICROSCOPIC
Glucose, UA: NEGATIVE mg/dL
Ketones, ur: 15 mg/dL — AB
Leukocytes,Ua: NEGATIVE
Nitrite: NEGATIVE
Protein, ur: NEGATIVE mg/dL
Specific Gravity, Urine: <1.005 — ABNORMAL LOW (ref 1.005–1.030)
pH: 6.5 (ref 5.0–8.0)

## 2023-06-13 LAB — CBC
HCT: 35.3 % — ABNORMAL LOW (ref 36.0–46.0)
Hemoglobin: 12.6 g/dL (ref 12.0–15.0)
MCH: 36.5 pg — ABNORMAL HIGH (ref 26.0–34.0)
MCHC: 35.7 g/dL (ref 30.0–36.0)
MCV: 102.3 fL — ABNORMAL HIGH (ref 80.0–100.0)
Platelets: 209 10*3/uL (ref 150–400)
RBC: 3.45 MIL/uL — ABNORMAL LOW (ref 3.87–5.11)
RDW: 12.7 % (ref 11.5–15.5)
WBC: 5.8 10*3/uL (ref 4.0–10.5)
nRBC: 0 % (ref 0.0–0.2)

## 2023-06-13 LAB — BASIC METABOLIC PANEL
Anion gap: 13 (ref 5–15)
BUN: 5 mg/dL — ABNORMAL LOW (ref 8–23)
CO2: 24 mmol/L (ref 22–32)
Calcium: 9.3 mg/dL (ref 8.9–10.3)
Chloride: 95 mmol/L — ABNORMAL LOW (ref 98–111)
Creatinine, Ser: 0.78 mg/dL (ref 0.44–1.00)
GFR, Estimated: >60 mL/min (ref >60)
Glucose, Bld: 100 mg/dL — ABNORMAL HIGH (ref 70–99)
Potassium: 3.5 mmol/L (ref 3.5–5.1)
Sodium: 132 mmol/L — ABNORMAL LOW (ref 135–145)

## 2023-06-13 LAB — URINALYSIS, MICROSCOPIC (REFLEX)
Bacteria, UA: NONE SEEN
WBC, UA: NONE SEEN WBC/hpf (ref 0–5)

## 2023-06-13 LAB — TSH: TSH: 3.42 u[IU]/mL (ref 0.350–4.500)

## 2023-06-13 LAB — FOLATE: Folate: 3.2 ng/mL — ABNORMAL LOW (ref >5.9)

## 2023-06-13 LAB — VITAMIN B12: Vitamin B-12: 488 pg/mL (ref 180–914)

## 2023-06-13 MED ORDER — THIAMINE MONONITRATE 100 MG PO TABS
100.0000 mg | ORAL_TABLET | Freq: Every day | ORAL | Status: DC
  Administered 2023-06-13 – 2023-06-15 (×3): 100 mg via ORAL
  Filled 2023-06-13 (×3): qty 1

## 2023-06-13 MED ORDER — POTASSIUM CHLORIDE 20 MEQ PO PACK
20.0000 meq | PACK | Freq: Once | ORAL | Status: AC
  Administered 2023-06-13: 20 meq via ORAL
  Filled 2023-06-13: qty 1

## 2023-06-13 MED ORDER — ASPIRIN 81 MG PO TBEC
81.0000 mg | DELAYED_RELEASE_TABLET | Freq: Every day | ORAL | Status: DC
  Administered 2023-06-15: 81 mg via ORAL
  Filled 2023-06-13 (×3): qty 1

## 2023-06-13 MED ORDER — FOLIC ACID 1 MG PO TABS
1.0000 mg | ORAL_TABLET | Freq: Every day | ORAL | Status: DC
  Administered 2023-06-13 – 2023-06-15 (×3): 1 mg via ORAL
  Filled 2023-06-13 (×3): qty 1

## 2023-06-13 MED ORDER — METOPROLOL SUCCINATE ER 25 MG PO TB24
25.0000 mg | ORAL_TABLET | Freq: Every day | ORAL | Status: DC
  Administered 2023-06-13 – 2023-06-15 (×3): 25 mg via ORAL
  Filled 2023-06-13 (×3): qty 1

## 2023-06-13 MED ORDER — MAGIC MOUTHWASH
15.0000 mL | Freq: Three times a day (TID) | ORAL | Status: DC | PRN
  Administered 2023-06-13 – 2023-06-15 (×5): 15 mL via ORAL
  Filled 2023-06-13 (×7): qty 15

## 2023-06-13 MED ORDER — PHENOL 1.4 % MT LIQD: 1.0000 | OROMUCOSAL | Status: DC | PRN

## 2023-06-13 MED ORDER — LORAZEPAM 2 MG/ML IJ SOLN: 1.0000 mg | INTRAMUSCULAR | Status: DC | PRN

## 2023-06-13 MED ORDER — AMLODIPINE BESYLATE 5 MG PO TABS
5.0000 mg | ORAL_TABLET | Freq: Every day | ORAL | Status: DC
Start: 2023-06-13 — End: 2023-06-14
  Administered 2023-06-13 – 2023-06-14 (×2): 5 mg via ORAL
  Filled 2023-06-13 (×2): qty 1

## 2023-06-13 MED ORDER — AMLODIPINE BESY-BENAZEPRIL HCL 5-10 MG PO CAPS: 1.0000 | ORAL_CAPSULE | Freq: Every day | ORAL | Status: DC

## 2023-06-13 MED ORDER — ADULT MULTIVITAMIN W/MINERALS CH
1.0000 | ORAL_TABLET | Freq: Every day | ORAL | Status: DC
  Administered 2023-06-13 – 2023-06-15 (×3): 1 via ORAL
  Filled 2023-06-13 (×3): qty 1

## 2023-06-13 MED ORDER — BENAZEPRIL HCL 5 MG PO TABS
10.0000 mg | ORAL_TABLET | Freq: Every day | ORAL | Status: DC
  Administered 2023-06-13 – 2023-06-14 (×2): 10 mg via ORAL
  Filled 2023-06-13 (×2): qty 2

## 2023-06-13 MED ORDER — ENOXAPARIN SODIUM 40 MG/0.4ML IJ SOSY
40.0000 mg | PREFILLED_SYRINGE | INTRAMUSCULAR | Status: DC
  Administered 2023-06-13 – 2023-06-15 (×2): 40 mg via SUBCUTANEOUS
  Filled 2023-06-13 (×3): qty 0.4

## 2023-06-13 MED ORDER — MIRTAZAPINE 15 MG PO TABS
45.0000 mg | ORAL_TABLET | Freq: Every day | ORAL | Status: DC
  Administered 2023-06-13 – 2023-06-14 (×2): 45 mg via ORAL
  Filled 2023-06-13 (×2): qty 3

## 2023-06-13 MED ORDER — ZOLPIDEM TARTRATE 5 MG PO TABS
5.0000 mg | ORAL_TABLET | Freq: Every day | ORAL | Status: DC
Start: 2023-06-13 — End: 2023-06-15
  Administered 2023-06-13 – 2023-06-14 (×2): 5 mg via ORAL
  Filled 2023-06-13 (×2): qty 1

## 2023-06-13 MED ORDER — LORAZEPAM 1 MG PO TABS: 1.0000 mg | ORAL_TABLET | ORAL | Status: DC | PRN

## 2023-06-13 MED ORDER — THIAMINE HCL 100 MG/ML IJ SOLN
100.0000 mg | Freq: Every day | INTRAMUSCULAR | Status: DC
  Filled 2023-06-13 (×2): qty 2

## 2023-06-13 MED ORDER — ALBUTEROL SULFATE (2.5 MG/3ML) 0.083% IN NEBU: 3.0000 mL | INHALATION_SOLUTION | RESPIRATORY_TRACT | Status: DC | PRN

## 2023-06-13 NOTE — Progress Notes (Signed)
 [facility] PROGRESS NOTE   NYX KEADY ZOX:[id number] DOB: 1950/10/04 DOA: 06/12/2023  PCP: Sharmon Revere, MD  Brief History: 73 y.o. female with history of hypertension, breast cancer status postlumpectomy, tobacco abuse, centrilobular emphysema noted in the CT scan presented to the ER with complaint of chest pain.  She had a stress test in 2024 which was positive for infarct.  She underwent echocardiogram which showed normal LVEF without any wall motion abnormalities.  It appears that there was a plan to do CT coronary but patient has not undergone the study.  Looks like she was lost to follow-up.  Patient hospitalized for further management.  Consultants: Cardiology  Procedures: None yet    Subjective/Interval History: Patient mentioned that her chest pain has eased up.  She has a sore throat which is what she is mainly concerned about this morning.  Denies any headaches.  No nausea or vomiting.    Assessment/Plan:  Chest pain Etiology unclear.  CT angiogram was negative for PE.  Concern is for angina.  EKG however without any obvious ischemic changes.  Nonspecific T wave changes noted.  Troponins have been normal. Echocardiogram has been ordered.  Cardiology has been consulted.  Patient remains n.p.o. awaiting cardiology input. Patient is on aspirin.  Will check lipid panel.  Essential hypertension Continue with home medications including amlodipine and ACE inhibitor.  Sore throat Strep was negative.  Chloraseptic Spray and mouthwash will be ordered.  No lesions on oral examination.  History of palpitations with history of PVCs Monitor on telemetry.  TSH is normal.  Macrocytosis B12 level is 488.  Folic acid is low was 3.2.  Will start supplementation.  Hypokalemia/hyponatremia Supplement potassium.  Monitor sodium levels closely.  History of breast cancer s/p lumpectomy Outpatient management.  Tobacco and alcohol abuse CIWA protocol.   Counseled.   DVT Prophylaxis: Lovenox Code Status: Full code Family Communication: Discussed with patient Disposition Plan: Hopefully return home when improved  Status is: Observation The patient remains OBS appropriate and may or may not d/c before 2 midnights.      Medications: Scheduled:  amLODipine  5 mg Oral Daily   And   benazepril  10 mg Oral Daily   aspirin EC  81 mg Oral Daily   enoxaparin (LOVENOX) injection  40 mg Subcutaneous Q24H   folic acid  1 mg Oral Daily   mirtazapine  45 mg Oral QHS   multivitamin with minerals  1 tablet Oral Daily   thiamine  100 mg Oral Daily   Or   thiamine  100 mg Intravenous Daily   zolpidem  5 mg Oral QHS   Continuous: WJX:[id number], LORazepam **OR** LORazepam, magic mouthwash, phenol  Antibiotics: Anti-infectives (From admission, onward)    None       Objective:  Vital Signs  Vitals:   06/13/23 0330 06/13/23 0400 06/13/23 0430 06/13/23 0601  BP: (!) 150/93 (!) 170/104 (!) 140/96 (!) 164/97  Pulse:    92  Resp: (!) 24 19 15 18   Temp:    98.1 F (36.7 C)  TempSrc:    Oral  SpO2:    100%  Weight:      Height:       No intake or output data in the 24 hours ending 06/13/23 1007 Filed Weights   06/12/23 2059  Weight: 59 kg    General appearance: Awake alert.  In no distress No lesions identified in the oral cavity Resp: Clear to auscultation bilaterally.  Normal effort Cardio:  S1-S2 is normal regular.  No S3-S4.  No rubs murmurs or bruit GI: Abdomen is soft.  Nontender nondistended.  Bowel sounds are present normal.  No masses organomegaly Extremities: No edema.  Full range of motion of lower extremities. Neurologic: Alert and oriented x3.  No focal neurological deficits.    Lab Results:  Data Reviewed: I have personally reviewed following labs and reports of the imaging studies  CBC: Recent Labs  Lab 06/12/23 2112 06/13/23 0525  WBC 5.9 5.8  HGB 13.0 12.6  HCT 37.1 35.3*  MCV 103.1* 102.3*  PLT  237 209    Basic Metabolic Panel: Recent Labs  Lab 06/12/23 2112 06/13/23 0525  NA 131* 132*  K 3.4* 3.5  CL 93* 95*  CO2 23 24  GLUCOSE 114* 100*  BUN <5* 5*  CREATININE 0.90 0.78  CALCIUM 9.4 9.3    GFR: Estimated Creatinine Clearance: 59.2 mL/min (by C-G formula based on SCr of 0.78 mg/dL).  Liver Function Tests: Recent Labs  Lab 06/12/23 2112  AST 34  ALT 28  ALKPHOS 59  BILITOT 1.0  PROT 6.5  ALBUMIN 3.6    Recent Labs  Lab 06/12/23 2112  LIPASE 24    Thyroid Function Tests: Recent Labs    06/13/23 0525  TSH 3.420    Anemia Panel: Recent Labs    06/13/23 0525  VITAMINB12 488  FOLATE 3.2*    Recent Results (from the past 240 hours)  Resp panel by RT-PCR (RSV, Flu A&B, Covid) Anterior Nasal Swab     Status: None   Collection Time: 06/12/23  9:12 PM   Specimen: Anterior Nasal Swab  Result Value Ref Range Status   SARS Coronavirus 2 by RT PCR NEGATIVE NEGATIVE Final   Influenza A by PCR NEGATIVE NEGATIVE Final   Influenza B by PCR NEGATIVE NEGATIVE Final    Comment: (NOTE) The Xpert Xpress SARS-CoV-2/FLU/RSV plus assay is intended as an aid in the diagnosis of influenza from Nasopharyngeal swab specimens and should not be used as a sole basis for treatment. Nasal washings and aspirates are unacceptable for Xpert Xpress SARS-CoV-2/FLU/RSV testing.  Fact Sheet for Patients: BloggerCourse.com  Fact Sheet for Healthcare Providers: SeriousBroker.it  This test is not yet approved or cleared by the [place] FDA and has been authorized for detection and/or diagnosis of SARS-CoV-2 by FDA under an Emergency Use Authorization (EUA). This EUA will remain in effect (meaning this test can be used) for the duration of the COVID-19 declaration under Section 564(b)(1) of the Act, 21 U.S.C. section 360bbb-3(b)(1), unless the authorization is terminated or revoked.     Resp Syncytial Virus by PCR  NEGATIVE NEGATIVE Final    Comment: (NOTE) Fact Sheet for Patients: BloggerCourse.com  Fact Sheet for Healthcare Providers: SeriousBroker.it  This test is not yet approved or cleared by the [place] FDA and has been authorized for detection and/or diagnosis of SARS-CoV-2 by FDA under an Emergency Use Authorization (EUA). This EUA will remain in effect (meaning this test can be used) for the duration of the COVID-19 declaration under Section 564(b)(1) of the Act, 21 U.S.C. section 360bbb-3(b)(1), unless the authorization is terminated or revoked.  Performed at [facility], [address] [place], [place] [address]   Group A Strep by PCR     Status: None   Collection Time: 06/12/23 10:25 PM   Specimen: Throat; Sterile Swab  Result Value Ref Range Status   Group A Strep by PCR NOT DETECTED NOT DETECTED  Final    Comment: Performed at [facility], [address], [place], [place] [address]      Radiology Studies: CT Angio Chest PE W and/or Wo Contrast Result Date: 06/13/2023 CLINICAL DATA:  Shortness of breath and chest pain, initial encounter EXAM: CT ANGIOGRAPHY CHEST WITH CONTRAST TECHNIQUE: Multidetector CT imaging of the chest was performed using the standard protocol during bolus administration of intravenous contrast. Multiplanar CT image reconstructions and MIPs were obtained to evaluate the vascular anatomy. RADIATION DOSE REDUCTION: This exam was performed according to the departmental dose-optimization program which includes automated exposure control, adjustment of the mA and/or kV according to patient size and/or use of iterative reconstruction technique. CONTRAST:  75mL OMNIPAQUE IOHEXOL 350 MG/ML SOLN COMPARISON:  Chest x-ray from earlier in the same day. FINDINGS: Cardiovascular: Thoracic aorta and its branches show mild atherosclerotic calcifications. No aneurysmal dilatation or dissection is seen. The heart  is at the upper limits of normal in size. The pulmonary artery demonstrates a normal branching pattern bilaterally. No intraluminal filling defect to suggest pulmonary embolism is noted. Mediastinum/Nodes: Thoracic inlet is within normal limits. No hilar or mediastinal adenopathy is noted. The esophagus as visualized is within normal limits. Lungs/Pleura: Mild emphysematous changes are noted. Mild scarring is noted in the left lung base posteriorly. No focal infiltrate, effusion or parenchymal nodule is seen. Upper Abdomen: Visualized upper abdomen shows no acute abnormality. Musculoskeletal: No chest wall abnormality. No acute or significant osseous findings. Review of the MIP images confirms the above findings. IMPRESSION: No evidence of pulmonary emboli. No acute abnormality is seen. Aortic Atherosclerosis (ICD10-I70.0) and Emphysema (ICD10-J43.9). Electronically Signed   By: Alcide Clever M.D.   On: 06/13/2023 00:16   DG Chest 2 View Result Date: 06/12/2023 CLINICAL DATA:  Left-sided chest pain and shortness of breath. EXAM: CHEST - 2 VIEW COMPARISON:  None Available. FINDINGS: The heart size and mediastinal contours are within normal limits. There is moderate severity calcification of the aortic arch and tortuosity of the descending thoracic aorta. Both lungs are clear. Radiopaque surgical clips are seen within the soft tissues of the left breast. The visualized skeletal structures are unremarkable. IMPRESSION: No active cardiopulmonary disease. Electronically Signed   By: Aram Candela M.D.   On: 06/12/2023 22:07       LOS: 0 days   Sherika Kubicki Rito Ehrlich  [facility] Pager on [email or web address]  06/13/2023, 10:07 AM

## 2023-06-13 NOTE — Consult Note (Addendum)
 Cardiology Consultation   Patient ID: Christina Sanchez MRN: [id number]; DOB: 08-19-1950  Admit date: 06/12/2023 Date of Consult: 06/13/2023  PCP:  Sharmon Revere, MD   [facility] Providers Cardiologist:  Bryan Lemma, MD   {  Patient Profile:   Christina Sanchez is a 73 y.o. female with a hx of symptomatic PVCs, hypertension, hyperlipidemia, current smoker,emphysema, history of breast cancer, with a history of an abnormal myocardial perfusion study (09/2022), who is being seen 06/13/2023 for the evaluation of chest pain at the request of Dr. Rito Ehrlich.  History of Present Illness:   Ms. Christina Sanchez has a past medical history of symptomatic PVCs, hypertension, hyperlipidemia, current smoker,emphysema, history of breast cancer, with a history of an abnormal myocardial perfusion study (09/2022). She presented to [facility] ED on 06/12/2023 via EMS for left-sided chest pain and shortness of breath that started the night prior.   Per chart review, she reported shortness of breath on exertion for upwards of 2 weeks and developed retrosternal chest pressure over the past 3 days. She states that her chest pain also occurs when she is experiencing palpitations. States that most of her symptoms are relieved with rest.   Relevant workup in the ED includes: EKG showing sinus rhythm without signs of acute ischemia, troponin negative x 2, CTA chest negative for PE but did show aortic atherosclerosis and emphysema. Patient was admitted to [facility] and cardiology was asked to consult.  Regarding her cardiac history, she established care with Dr. Herbie Baltimore in 09/2022. She had undergone a stress test in June 2024 that showed a fixed apical inferior perfusion defect with abnormal wall motion consistent with possible prior infarction, EF was 41%, no ischemia. Echocardiogram done shortly after in 11/2022 showed: EF 55-60%, no LV RWMA, grade I diastolic dysfunction, normal RV function, mild dilation of aorta.    She was last seen by Dr. Herbie Baltimore via telehealth in 10/2022. At this visit they discussed her abnormal results of her stress test. They discussed getting an updated echocardiogram (results listed above). Dr. Herbie Baltimore discussed favoring cardiac catheterization prior to getting the echo and would consider a coronary CTA if the echo also showed no regional wall motion abnormalities. Patient's medication regimen at the time of this visit was CoReg 3.125 mg BID, Lotrel 5-10 mg daily, Lipitor 10 mg daily. The plan was for the patient to get the updated echo, which she did, then return in August 2024 to discuss the options at that time, she never followed up with clinic.    After speaking with patient, she agrees with the history as stated above.  She states that she has been having chest pain, shortness of breath, generalized weakness/fatigue for the last couple of weeks, worsening over the last 3 days.  She states that the only thing that relieves her chest pain/shortness of breath is rest.  When she feels the symptoms coming on, she will typically rest and her pain will subside within 5 minutes.  She admits to understanding that she was supposed to undergo further workup with Dr. Herbie Baltimore but was lost to follow-up.  She reports poor PO intake over the last couple of days due to ongoing thrush in her mouth that is caused pain and worsening sense of taste.  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   Current every day smoker    Started smoking at age 78 (24.5 pack smoking history.  On going 1/2 PPD   Hyperlipidemia    Hypertension    MDD (major depressive disorder)    Without psychotic features.  Pending behavioral  health evaluation.   Personal history of radiation therapy    Past Surgical History:  Procedure Laterality Date   BREAST LUMPECTOMY     BREAST LUMPECTOMY WITH RADIOACTIVE SEED AND SENTINEL LYMPH NODE BIOPSY Left 06/30/2021   Procedure: LEFT BREAST LUMPECTOMY WITH RADIOACTIVE SEED AND SENTINEL LYMPH NODE BIOPSY;  Surgeon: Harriette Bouillon, MD;  Location: [facility];  Service: General;  Laterality: Left;   fistula track removal     rectal   FOOT SURGERY     Lower Extremity ABIs  09/03/2021   Resting left and right ABI and TBI within normal range.  No evidence of bilateral lower extremity arterial disease.   NM MYOVIEW LTD  09/29/2022   Lexiscan - > INTERMEDIATE RISK (due to reduced systolic function and prior infarct).  Fixed apical inferior perfusion defect with abnormal wall motion consistent with infarct.  EF 30 to 44% with apical inferior hypokinesis.  NO ISCHEMIA.  Unable to walk therefore unable to assess the effect of exercise on PVCs.   PARTIAL HYSTERECTOMY     TRANSTHORACIC ECHOCARDIOGRAM  08/03/2021   Normal LV size and function.  EF 60 to 65%.  No RWMA.  GR 1 DD/impaired relaxation (normal for age).  Normal RV size and function.  Normal PAP.  Normal RAP.  Normal MV.  AoV sclerosis but no stenosis.   TRANSTHORACIC ECHOCARDIOGRAM  07/14/2022   [facility]): Normal LV size and function.  EF 60 to 65%.  No RWMA.  Mild LVH.  G1 DD.  Normal RV.  Essentially normal valves.  Normal RV.  Normal RAP.   ZIO PATCH MONITOR  05/2022   (2/14-28/2024) (ordered by [facility]): Sinus rhythm, rate range 55 to 141 bpm.  AVG 75 bpm.  Occasional (1.8%) PVCs with rare couplets-symptomatic..  Some bigeminy and trigeminy.  Rare (<1%) isolated PACs couplets and triplets.  9 short atrial runs: Fastest-11 beats (4.3 sec), rate 133-164 bpm - Avg 155 BPM; longest 11 beats (6.5 sec), rate 72-133 bpm, Avg 110 bpm.    Home Medications:  Prior to Admission medications    Medication Sig Start Date End Date Taking? Authorizing Provider  albuterol (VENTOLIN HFA) 108 (90 Base) MCG/ACT inhaler Inhale 2 puffs into the lungs as needed for wheezing or shortness of breath. 05/27/21  Yes [provider]  amLODipine-benazepril (LOTREL) 5-10 MG capsule Take 1 capsule by mouth daily. 12/02/18  Yes [provider]  fluticasone (FLONASE) 50 MCG/ACT nasal spray Place 2 sprays into both nostrils daily as needed for allergies.   Yes [provider]  mirtazapine (REMERON) 45 MG tablet Take 45 mg by mouth at bedtime. Taken at 1900   Yes [provider]  zolpidem (AMBIEN) 10 MG tablet Take 10 mg by mouth at bedtime. Taken 4 hours after the mirtazapine at 2200   Yes [provider]  carvedilol (COREG) 3.125 MG tablet Take 1 tablet (3.125 mg total) by mouth 2 (two) times daily. Patient not taking: Reported on 06/13/2023 10/20/22 01/18/23  Marykay Lex, MD   Inpatient Medications: Scheduled Meds:  amLODipine  5 mg Oral Daily  And   benazepril  10 mg Oral Daily   aspirin EC  81 mg Oral Daily   enoxaparin (LOVENOX) injection  40 mg Subcutaneous Q24H   folic acid  1 mg Oral Daily   metoprolol succinate  25 mg Oral Daily   mirtazapine  45 mg Oral QHS   multivitamin with minerals  1 tablet Oral Daily   thiamine  100 mg Oral Daily   Or   thiamine  100 mg Intravenous Daily   zolpidem  5 mg Oral QHS   Continuous Infusions:  PRN Meds: albuterol, LORazepam **OR** LORazepam, magic mouthwash, phenol  Allergies:    Allergies  Allergen Reactions   Aspirin     "burning feeling in my stomach". She can tolerate 1 Aleve.   Latex Nausea Only   Lexapro [Escitalopram] Diarrhea   Social History:   Social History   Socioeconomic History   Marital status: Single    Spouse name: Not on file   Number of children: 1   Years of education: Not on file   Highest education level: High school graduate  Occupational History   Not on file   Tobacco Use   Smoking status: Every Day    Current packs/day: 0.50    Average packs/day: 0.5 packs/day for 50.0 years (25.0 ttl pk-yrs)    Types: Cigarettes   Smokeless tobacco: Never   Tobacco comments:    8-9 cigarattes/day  Vaping Use   Vaping status: Never Used  Substance and Sexual Activity   Alcohol use: Yes    Alcohol/week: 15.0 standard drinks of alcohol    Types: 15 Glasses of wine per week    Comment: occ wine   Drug use: No   Sexual activity: Not on file  Other Topics Concern   Not on file  Social History Narrative   Lives at home with her son   Right handed   Caffeine: 1 cup of tea daily   Social Drivers of Corporate investment banker Strain: Not on file  Food Insecurity: No Food Insecurity (06/13/2023)   Hunger Vital Sign    Worried About Running Out of Food in the Last Year: Never true    Ran Out of Food in the Last Year: Never true  Transportation Needs: Unmet Transportation Needs (06/13/2023)   PRAPARE - Administrator, Civil Service (Medical): Yes    Lack of Transportation (Non-Medical): No  Physical Activity: Not on file  Stress: Not on file  Social Connections: Socially Isolated (06/13/2023)   Social Connection and Isolation Panel [NHANES]    Frequency of Communication with Friends and Family: More than three times a week    Frequency of Social Gatherings with Friends and Family: Never    Attends Religious Services: Never    Database administrator or Organizations: No    Attends Banker Meetings: Never    Marital Status: Divorced  Catering manager Violence: Not At Risk (06/13/2023)   Humiliation, Afraid, Rape, and Kick questionnaire    Fear of Current or Ex-Partner: No    Emotionally Abused: No    Physically Abused: No    Sexually Abused: No    Family History:   Family History  Problem Relation Age of Onset   Diabetes Brother    Migraines Neg Hx    Headache Neg Hx    Breast cancer Neg Hx     ROS:  Please see the history  of present illness.  All other ROS reviewed  and negative.     Physical Exam/Data:   Vitals:   06/13/23 0400 06/13/23 0430 06/13/23 0601 06/13/23 1025  BP: (!) 170/104 (!) 140/96 (!) 164/97 (!) 162/112  Pulse:   92 88  Resp: 19 15 18  (!) 27  Temp:   98.1 F (36.7 C) 98 F (36.7 C)  TempSrc:   Oral Oral  SpO2:   100% 100%  Weight:      Height:       No intake or output data in the 24 hours ending 06/13/23 1429    06/12/2023    8:59 PM 10/20/2022   10:33 AM 09/13/2022    8:53 AM  Last 3 Weights  Weight (lbs) 130 lb 138 lb 138 lb 12.8 oz  Weight (kg) 58.968 kg 62.596 kg 62.959 kg     Body mass index is 20.98 kg/m.  General:  Well nourished, well developed, in no acute distress, on room air HEENT: normal Neck: no JVD Vascular: No carotid bruits; Distal pulses 2+ bilaterally Cardiac:  normal S1, S2; RRR; no murmur  Lungs:  clear to auscultation bilaterally, no wheezing, rhonchi or rales  Abd: soft, nontender Ext: no edema Musculoskeletal:  No deformities Skin: warm and dry  Neuro:  no focal abnormalities noted Psych:  Normal affect   EKG:  The EKG was personally reviewed and demonstrates:  sinus rhythm, HR 86, LAD  Telemetry:  Telemetry was personally reviewed and demonstrates:  sinus rhythm, HR 80s frequent PVCs  Relevant CV Studies: Updated echocardiogram ordered, pending results  Echocardiogram 12/08/2022 IMPRESSIONS   1. Left ventricular ejection fraction, by estimation, is 55 to 60%. The  left ventricle has normal function. The left ventricle has no regional  wall motion abnormalities. Left ventricular diastolic parameters are  consistent with Grade I diastolic  dysfunction (impaired relaxation).   2. Right ventricular systolic function is normal. The right ventricular  size is normal. There is normal pulmonary artery systolic pressure.   3. The mitral valve is normal in structure. Trivial mitral valve  regurgitation. No evidence of mitral stenosis.   4. The  aortic valve is normal in structure. There is mild calcification  of the aortic valve. Aortic valve regurgitation is not visualized. Aortic  valve sclerosis/calcification is present, without any evidence of aortic  stenosis.   5. Aortic dilatation noted. There is mild dilatation of the aortic root,  measuring 41 mm.   6. The inferior vena cava is normal in size with greater than 50%  respiratory variability, suggesting right atrial pressure of 3 mmHg.   Nuclear Stress Test, 09/29/2022   Findings are consistent with infarction. The study is intermediate risk.   LV perfusion is abnormal. There is no evidence of ischemia. There is evidence of infarction. Defect 1: There is a small defect with mild reduction in uptake present in the apical inferior location(s) that is fixed. There is abnormal wall motion in the defect area. Consistent with infarction.   Left ventricular function is abnormal. Global function is mildly reduced. Nuclear stress EF: 41 %. The left ventricular ejection fraction is moderately decreased (30-44%). End diastolic cavity size is normal. End systolic cavity size is normal.   Fixed apical inferior perfusion defect  with abnormal wall motion consistent with artifact No PVCs during Lexiscan stress LVEF 41% Intermediate risk study due to systolic dysfunction.  No ischemia.    Laboratory Data:  High Sensitivity Troponin:   Recent Labs  Lab 06/12/23 2112 06/12/23 2301  TROPONINIHS 8 7  Chemistry Recent Labs  Lab 06/12/23 2112 06/13/23 0525  NA 131* 132*  K 3.4* 3.5  CL 93* 95*  CO2 23 24  GLUCOSE 114* 100*  BUN <5* 5*  CREATININE 0.90 0.78  CALCIUM 9.4 9.3  GFRNONAA >60 >60  ANIONGAP 15 13    Recent Labs  Lab 06/12/23 2112  PROT 6.5  ALBUMIN 3.6  AST 34  ALT 28  ALKPHOS 59  BILITOT 1.0   Lipids  Recent Labs  Lab 06/13/23 0525  CHOL 159  TRIG 73  HDL 70  LDLCALC 74  CHOLHDL 2.3    Hematology Recent Labs  Lab 06/12/23 2112 06/13/23 0525   WBC 5.9 5.8  RBC 3.60* 3.45*  HGB 13.0 12.6  HCT 37.1 35.3*  MCV 103.1* 102.3*  MCH 36.1* 36.5*  MCHC 35.0 35.7  RDW 12.5 12.7  PLT 237 209   Thyroid  Recent Labs  Lab 06/13/23 0525  TSH 3.420    BNPNo results for input(s): "BNP", "PROBNP" in the last 168 hours.  DDimer No results for input(s): "DDIMER" in the last 168 hours.  Radiology/Studies:  CT Angio Chest PE W and/or Wo Contrast Result Date: 06/13/2023 CLINICAL DATA:  Shortness of breath and chest pain, initial encounter EXAM: CT ANGIOGRAPHY CHEST WITH CONTRAST TECHNIQUE: Multidetector CT imaging of the chest was performed using the standard protocol during bolus administration of intravenous contrast. Multiplanar CT image reconstructions and MIPs were obtained to evaluate the vascular anatomy. RADIATION DOSE REDUCTION: This exam was performed according to the departmental dose-optimization program which includes automated exposure control, adjustment of the mA and/or kV according to patient size and/or use of iterative reconstruction technique. CONTRAST:  75mL OMNIPAQUE IOHEXOL 350 MG/ML SOLN COMPARISON:  Chest x-ray from earlier in the same day. FINDINGS: Cardiovascular: Thoracic aorta and its branches show mild atherosclerotic calcifications. No aneurysmal dilatation or dissection is seen. The heart is at the upper limits of normal in size. The pulmonary artery demonstrates a normal branching pattern bilaterally. No intraluminal filling defect to suggest pulmonary embolism is noted. Mediastinum/Nodes: Thoracic inlet is within normal limits. No hilar or mediastinal adenopathy is noted. The esophagus as visualized is within normal limits. Lungs/Pleura: Mild emphysematous changes are noted. Mild scarring is noted in the left lung base posteriorly. No focal infiltrate, effusion or parenchymal nodule is seen. Upper Abdomen: Visualized upper abdomen shows no acute abnormality. Musculoskeletal: No chest wall abnormality. No acute or  significant osseous findings. Review of the MIP images confirms the above findings. IMPRESSION: No evidence of pulmonary emboli. No acute abnormality is seen. Aortic Atherosclerosis (ICD10-I70.0) and Emphysema (ICD10-J43.9). Electronically Signed   By: Alcide Clever M.D.   On: 06/13/2023 00:16   DG Chest 2 View Result Date: 06/12/2023 CLINICAL DATA:  Left-sided chest pain and shortness of breath. EXAM: CHEST - 2 VIEW COMPARISON:  None Available. FINDINGS: The heart size and mediastinal contours are within normal limits. There is moderate severity calcification of the aortic arch and tortuosity of the descending thoracic aorta. Both lungs are clear. Radiopaque surgical clips are seen within the soft tissues of the left breast. The visualized skeletal structures are unremarkable. IMPRESSION: No active cardiopulmonary disease. Electronically Signed   By: Aram Candela M.D.   On: 06/12/2023 22:07   Assessment and Plan:   Chest pain Abnormal myocardial perfusion study Dyspnea on exertion Aortic atherosclerosis on CT Negative troponin x 2 CTA chest negative for PE, showed aortic atherosclerosis  EKG without acute ischemic changes  Echo from 11/2022 showed:  EF 55-60%, no LV RWMA, grade I diastolic dysfunction, normal RV, mild dilation of aortic root She denies any current chest pain, as she has remained resting in the bed  Lipid panel ordered, pending results  Updated echocardiogram ordered, pending results  Primary team ordered ASA 81 mg daily, patient has been refusing due to allergy that she described as "stomach burning" discussed the importance of her taking this medication and she agreed to take it  With prior abnormal stress test, typical chest pain and worsening symptoms patient will likely require cardiac catheterization, will discuss with MD  Patient prefers to get her cath done tomorrow  Orders will be placed, she will be NPO at midnight 3/4  Informed Consent   Shared Decision  Making/Informed Consent The risks [stroke (1 in 1000), death (1 in 1000), kidney failure [usually temporary] (1 in 500), bleeding (1 in 200), allergic reaction [possibly serious] (1 in 200)], benefits (diagnostic support and management of coronary artery disease) and alternatives of a cardiac catheterization were discussed in detail with Ms. Borak and she is willing to proceed.    History of symptomatic PVCs TSH normal  Continue on telemetry Patient denies any symptomatic palpitations at this time Start Toprol 25 mg daily   Hypertension She was having some elevated BP readings so she was restarted on her home medication today as listed below Currently on PTA amlodipine 5 mg + benazepril 10 mg daily Start Toprol 25 mg daily   Per primary Sore throat, thrush History of breast cancer Tobacco abuse Alcohol abuse, on CIWA protocol Electrolyte imbalances   For questions or updates, please contact [facility] Please consult [email or web address] for contact info under    Signed, Olena Leatherwood, PA-C  06/13/2023 2:29 PM   Patient seen and examined. Agree with assessment and plan.  This Christina Sanchez is a 73 year old African-American female who has a history of symptomatic PVCs, hypertension, hyperlipidemia, tobacco use with emphysema, as well as history of breast cancer.  June 2024 she was evaluated by Dr. Bryan Lemma.  A stress test showed a possible fixed apical inferior perfusion defect with abnormal wall motion, EF was 41%.  Subsequent echo Doppler study in August 2024 showed EF at 55 to 60%.  His only, the patient has been experiencing since episodic episodes of chest discomfort.  She states more often this is described as a chest heaviness and more often is precipitated with exertion.  She presented to the emergency room.  Troponins are negative.  CT angio of chest did not show any pulmonary emboli or acute abnormality.  There is aortic atherosclerosis and emphysema.  Exam,  blood pressure is elevated and when I saw the patient in ER was 148/98 but had been higher previously.  HEENT is unremarkable.  I did not hear carotid bruits.  Lungs reveal slightly decreased breath sounds without wheezing.  Rhythm was regular with 1/6 systolic murmur, 3 isolated PVC.  Abdomen was nontender.  Bowel sounds positive.  There was no significant edema.  ECG shows sinus rhythm at 86 with poor R wave progression anteriorly, and nonspecific T wave abnormality in lead aVL.  Troponins are negative.  Lipid studies show total cholesterol 159, LDL 74.  She was started on amlodipine and benazepril as her home medication.  With increased heart rate and continued blood pressure we will add Toprol-XL 25 mg daily rather than low-dose previous carvedilol.  Discussed definitive evaluation with cardiac catheterization and reviewed the risk benefits of the procedure in detail.  She prefers to have this study done tomorrow morning if at all possible rather than be added on for later today.   Lennette Bihari, MD, [facility] 06/13/2023 2:57 PM

## 2023-06-13 NOTE — H&P (View-Only) (Signed)
 Cardiology Consultation   Patient ID: [name] MRN: [id number]; DOB: 08-19-1950  Admit date: 06/12/2023 Date of Consult: 06/13/2023  PCP:  Sharmon Revere, MD   [facility] Providers Cardiologist:  Bryan Lemma, MD   {  Patient Profile:   Christina Sanchez is a 73 y.o. female with a hx of symptomatic PVCs, hypertension, hyperlipidemia, current smoker,emphysema, history of breast cancer, with a history of an abnormal myocardial perfusion study (09/2022), who is being seen 06/13/2023 for the evaluation of chest pain at the request of Dr. Rito Ehrlich.  History of Present Illness:   Christina Sanchez has a past medical history of symptomatic PVCs, hypertension, hyperlipidemia, current smoker,emphysema, history of breast cancer, with a history of an abnormal myocardial perfusion study (09/2022). She presented to [facility] ED on 06/12/2023 via EMS for left-sided chest pain and shortness of breath that started the night prior.   Per chart review, she reported shortness of breath on exertion for upwards of 2 weeks and developed retrosternal chest pressure over the past 3 days. She states that her chest pain also occurs when she is experiencing palpitations. States that most of her symptoms are relieved with rest.   Relevant workup in the ED includes: EKG showing sinus rhythm without signs of acute ischemia, troponin negative x 2, CTA chest negative for PE but did show aortic atherosclerosis and emphysema. Patient was admitted to [facility] and cardiology was asked to consult.  Regarding her cardiac history, she established care with Dr. Herbie Baltimore in 09/2022. She had undergone a stress test in June 2024 that showed a fixed apical inferior perfusion defect with abnormal wall motion consistent with possible prior infarction, EF was 41%, no ischemia. Echocardiogram done shortly after in 11/2022 showed: EF 55-60%, no LV RWMA, grade I diastolic dysfunction, normal RV function, mild dilation of aorta.    She was last seen by Dr. Herbie Baltimore via telehealth in 10/2022. At Christina visit they discussed her abnormal results of her stress test. They discussed getting an updated echocardiogram (results listed above). Dr. Herbie Baltimore discussed favoring cardiac catheterization prior to getting the echo and would consider a coronary CTA if the echo also showed no regional wall motion abnormalities. Patient's medication regimen at the time of Christina visit was CoReg 3.125 mg BID, Lotrel 5-10 mg daily, Lipitor 10 mg daily. The plan was for the patient to get the updated echo, which she did, then return in August 2024 to discuss the options at that time, she never followed up with clinic.    After speaking with patient, she agrees with the history as stated above.  She states that she has been having chest pain, shortness of breath, generalized weakness/fatigue for the last couple of weeks, worsening over the last 3 days.  She states that the only thing that relieves her chest pain/shortness of breath is rest.  When she feels the symptoms coming on, she will typically rest and her pain will subside within 5 minutes.  She admits to understanding that she was supposed to undergo further workup with Dr. Herbie Baltimore but was lost to follow-up.  She reports poor PO intake over the last couple of days due to ongoing thrush in her mouth that is caused pain and worsening sense of taste.  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   Current every day smoker    Started smoking at age 78 (24.5 pack smoking history.  On going 1/2 PPD   Hyperlipidemia    Hypertension    MDD (major depressive disorder)    Without psychotic features.  Pending behavioral  health evaluation.   Personal history of radiation therapy    Past Surgical History:  Procedure Laterality Date   BREAST LUMPECTOMY     BREAST LUMPECTOMY WITH RADIOACTIVE SEED AND SENTINEL LYMPH NODE BIOPSY Left 06/30/2021   Procedure: LEFT BREAST LUMPECTOMY WITH RADIOACTIVE SEED AND SENTINEL LYMPH NODE BIOPSY;  Surgeon: Harriette Bouillon, MD;  Location: [facility];  Service: General;  Laterality: Left;   fistula track removal     rectal   FOOT SURGERY     Lower Extremity ABIs  09/03/2021   Resting left and right ABI and TBI within normal range.  No evidence of bilateral lower extremity arterial disease.   NM MYOVIEW LTD  09/29/2022   Lexiscan - > INTERMEDIATE RISK (due to reduced systolic function and prior infarct).  Fixed apical inferior perfusion defect with abnormal wall motion consistent with infarct.  EF 30 to 44% with apical inferior hypokinesis.  NO ISCHEMIA.  Unable to walk therefore unable to assess the effect of exercise on PVCs.   PARTIAL HYSTERECTOMY     TRANSTHORACIC ECHOCARDIOGRAM  08/03/2021   Normal LV size and function.  EF 60 to 65%.  No RWMA.  GR 1 DD/impaired relaxation (normal for age).  Normal RV size and function.  Normal PAP.  Normal RAP.  Normal MV.  AoV sclerosis but no stenosis.   TRANSTHORACIC ECHOCARDIOGRAM  07/14/2022   [facility]): Normal LV size and function.  EF 60 to 65%.  No RWMA.  Mild LVH.  G1 DD.  Normal RV.  Essentially normal valves.  Normal RV.  Normal RAP.   ZIO PATCH MONITOR  05/2022   (2/14-28/2024) (ordered by [facility]): Sinus rhythm, rate range 55 to 141 bpm.  AVG 75 bpm.  Occasional (1.8%) PVCs with rare couplets-symptomatic..  Some bigeminy and trigeminy.  Rare (<1%) isolated PACs couplets and triplets.  9 short atrial runs: Fastest-11 beats (4.3 sec), rate 133-164 bpm - Avg 155 BPM; longest 11 beats (6.5 sec), rate 72-133 bpm, Avg 110 bpm.    Home Medications:  Prior to Admission medications    Medication Sig Start Date End Date Taking? Authorizing Provider  albuterol (VENTOLIN HFA) 108 (90 Base) MCG/ACT inhaler Inhale 2 puffs into the lungs as needed for wheezing or shortness of breath. 05/27/21  Yes [provider]  amLODipine-benazepril (LOTREL) 5-10 MG capsule Take 1 capsule by mouth daily. 12/02/18  Yes [provider]  fluticasone (FLONASE) 50 MCG/ACT nasal spray Place 2 sprays into both nostrils daily as needed for allergies.   Yes [provider]  mirtazapine (REMERON) 45 MG tablet Take 45 mg by mouth at bedtime. Taken at 1900   Yes [provider]  zolpidem (AMBIEN) 10 MG tablet Take 10 mg by mouth at bedtime. Taken 4 hours after the mirtazapine at 2200   Yes [provider]  carvedilol (COREG) 3.125 MG tablet Take 1 tablet (3.125 mg total) by mouth 2 (two) times daily. Patient not taking: Reported on 06/13/2023 10/20/22 01/18/23  Marykay Lex, MD   Inpatient Medications: Scheduled Meds:  amLODipine  5 mg Oral Daily  And   benazepril  10 mg Oral Daily   aspirin EC  81 mg Oral Daily   enoxaparin (LOVENOX) injection  40 mg Subcutaneous Q24H   folic acid  1 mg Oral Daily   metoprolol succinate  25 mg Oral Daily   mirtazapine  45 mg Oral QHS   multivitamin with minerals  1 tablet Oral Daily   thiamine  100 mg Oral Daily   Or   thiamine  100 mg Intravenous Daily   zolpidem  5 mg Oral QHS   Continuous Infusions:  PRN Meds: albuterol, LORazepam **OR** LORazepam, magic mouthwash, phenol  Allergies:    Allergies  Allergen Reactions   Aspirin     "burning feeling in my stomach". She can tolerate 1 Aleve.   Latex Nausea Only   Lexapro [Escitalopram] Diarrhea   Social History:   Social History   Socioeconomic History   Marital status: Single    Spouse name: Not on file   Number of children: 1   Years of education: Not on file   Highest education level: High school graduate  Occupational History   Not on file   Tobacco Use   Smoking status: Every Day    Current packs/day: 0.50    Average packs/day: 0.5 packs/day for 50.0 years (25.0 ttl pk-yrs)    Types: Cigarettes   Smokeless tobacco: Never   Tobacco comments:    8-9 cigarattes/day  Vaping Use   Vaping status: Never Used  Substance and Sexual Activity   Alcohol use: Yes    Alcohol/week: 15.0 standard drinks of alcohol    Types: 15 Glasses of wine per week    Comment: occ wine   Drug use: No   Sexual activity: Not on file  Other Topics Concern   Not on file  Social History Narrative   Lives at home with her son   Right handed   Caffeine: 1 cup of tea daily   Social Drivers of Corporate investment banker Strain: Not on file  Food Insecurity: No Food Insecurity (06/13/2023)   Hunger Vital Sign    Worried About Running Out of Food in the Last Year: Never true    Ran Out of Food in the Last Year: Never true  Transportation Needs: Unmet Transportation Needs (06/13/2023)   PRAPARE - Administrator, Civil Service (Medical): Yes    Lack of Transportation (Non-Medical): No  Physical Activity: Not on file  Stress: Not on file  Social Connections: Socially Isolated (06/13/2023)   Social Connection and Isolation Panel [NHANES]    Frequency of Communication with Friends and Family: More than three times a week    Frequency of Social Gatherings with Friends and Family: Never    Attends Religious Services: Never    Database administrator or Organizations: No    Attends Banker Meetings: Never    Marital Status: Divorced  Catering manager Violence: Not At Risk (06/13/2023)   Humiliation, Afraid, Rape, and Kick questionnaire    Fear of Current or Ex-Partner: No    Emotionally Abused: No    Physically Abused: No    Sexually Abused: No    Family History:   Family History  Problem Relation Age of Onset   Diabetes Brother    Migraines Neg Hx    Headache Neg Hx    Breast cancer Neg Hx     ROS:  Please see the history  of present illness.  All other ROS reviewed  and negative.     Physical Exam/Data:   Vitals:   06/13/23 0400 06/13/23 0430 06/13/23 0601 06/13/23 1025  BP: (!) 170/104 (!) 140/96 (!) 164/97 (!) 162/112  Pulse:   92 88  Resp: 19 15 18  (!) 27  Temp:   98.1 F (36.7 C) 98 F (36.7 C)  TempSrc:   Oral Oral  SpO2:   100% 100%  Weight:      Height:       No intake or output data in the 24 hours ending 06/13/23 1429    06/12/2023    8:59 PM 10/20/2022   10:33 AM 09/13/2022    8:53 AM  Last 3 Weights  Weight (lbs) 130 lb 138 lb 138 lb 12.8 oz  Weight (kg) 58.968 kg 62.596 kg 62.959 kg     Body mass index is 20.98 kg/m.  General:  Well nourished, well developed, in no acute distress, on room air HEENT: normal Neck: no JVD Vascular: No carotid bruits; Distal pulses 2+ bilaterally Cardiac:  normal S1, S2; RRR; no murmur  Lungs:  clear to auscultation bilaterally, no wheezing, rhonchi or rales  Abd: soft, nontender Ext: no edema Musculoskeletal:  No deformities Skin: warm and dry  Neuro:  no focal abnormalities noted Psych:  Normal affect   EKG:  The EKG was personally reviewed and demonstrates:  sinus rhythm, HR 86, LAD  Telemetry:  Telemetry was personally reviewed and demonstrates:  sinus rhythm, HR 80s frequent PVCs  Relevant CV Studies: Updated echocardiogram ordered, pending results  Echocardiogram 12/08/2022 IMPRESSIONS   1. Left ventricular ejection fraction, by estimation, is 55 to 60%. The  left ventricle has normal function. The left ventricle has no regional  wall motion abnormalities. Left ventricular diastolic parameters are  consistent with Grade I diastolic  dysfunction (impaired relaxation).   2. Right ventricular systolic function is normal. The right ventricular  size is normal. There is normal pulmonary artery systolic pressure.   3. The mitral valve is normal in structure. Trivial mitral valve  regurgitation. No evidence of mitral stenosis.   4. The  aortic valve is normal in structure. There is mild calcification  of the aortic valve. Aortic valve regurgitation is not visualized. Aortic  valve sclerosis/calcification is present, without any evidence of aortic  stenosis.   5. Aortic dilatation noted. There is mild dilatation of the aortic root,  measuring 41 mm.   6. The inferior vena cava is normal in size with greater than 50%  respiratory variability, suggesting right atrial pressure of 3 mmHg.   Nuclear Stress Test, 09/29/2022   Findings are consistent with infarction. The study is intermediate risk.   LV perfusion is abnormal. There is no evidence of ischemia. There is evidence of infarction. Defect 1: There is a small defect with mild reduction in uptake present in the apical inferior location(s) that is fixed. There is abnormal wall motion in the defect area. Consistent with infarction.   Left ventricular function is abnormal. Global function is mildly reduced. Nuclear stress EF: 41 %. The left ventricular ejection fraction is moderately decreased (30-44%). End diastolic cavity size is normal. End systolic cavity size is normal.   Fixed apical inferior perfusion defect  with abnormal wall motion consistent with artifact No PVCs during Lexiscan stress LVEF 41% Intermediate risk study due to systolic dysfunction.  No ischemia.    Laboratory Data:  High Sensitivity Troponin:   Recent Labs  Lab 06/12/23 2112 06/12/23 2301  TROPONINIHS 8 7  Chemistry Recent Labs  Lab 06/12/23 2112 06/13/23 0525  NA 131* 132*  K 3.4* 3.5  CL 93* 95*  CO2 23 24  GLUCOSE 114* 100*  BUN <5* 5*  CREATININE 0.90 0.78  CALCIUM 9.4 9.3  GFRNONAA >60 >60  ANIONGAP 15 13    Recent Labs  Lab 06/12/23 2112  PROT 6.5  ALBUMIN 3.6  AST 34  ALT 28  ALKPHOS 59  BILITOT 1.0   Lipids  Recent Labs  Lab 06/13/23 0525  CHOL 159  TRIG 73  HDL 70  LDLCALC 74  CHOLHDL 2.3    Hematology Recent Labs  Lab 06/12/23 2112 06/13/23 0525   WBC 5.9 5.8  RBC 3.60* 3.45*  HGB 13.0 12.6  HCT 37.1 35.3*  MCV 103.1* 102.3*  MCH 36.1* 36.5*  MCHC 35.0 35.7  RDW 12.5 12.7  PLT 237 209   Thyroid  Recent Labs  Lab 06/13/23 0525  TSH 3.420    BNPNo results for input(s): "BNP", "PROBNP" in the last 168 hours.  DDimer No results for input(s): "DDIMER" in the last 168 hours.  Radiology/Studies:  CT Angio Chest PE W and/or Wo Contrast Result Date: 06/13/2023 CLINICAL DATA:  Shortness of breath and chest pain, initial encounter EXAM: CT ANGIOGRAPHY CHEST WITH CONTRAST TECHNIQUE: Multidetector CT imaging of the chest was performed using the standard protocol during bolus administration of intravenous contrast. Multiplanar CT image reconstructions and MIPs were obtained to evaluate the vascular anatomy. RADIATION DOSE REDUCTION: Christina exam was performed according to the departmental dose-optimization program which includes automated exposure control, adjustment of the mA and/or kV according to patient size and/or use of iterative reconstruction technique. CONTRAST:  75mL OMNIPAQUE IOHEXOL 350 MG/ML SOLN COMPARISON:  Chest x-ray from earlier in the same day. FINDINGS: Cardiovascular: Thoracic aorta and its branches show mild atherosclerotic calcifications. No aneurysmal dilatation or dissection is seen. The heart is at the upper limits of normal in size. The pulmonary artery demonstrates a normal branching pattern bilaterally. No intraluminal filling defect to suggest pulmonary embolism is noted. Mediastinum/Nodes: Thoracic inlet is within normal limits. No hilar or mediastinal adenopathy is noted. The esophagus as visualized is within normal limits. Lungs/Pleura: Mild emphysematous changes are noted. Mild scarring is noted in the left lung base posteriorly. No focal infiltrate, effusion or parenchymal nodule is seen. Upper Abdomen: Visualized upper abdomen shows no acute abnormality. Musculoskeletal: No chest wall abnormality. No acute or  significant osseous findings. Review of the MIP images confirms the above findings. IMPRESSION: No evidence of pulmonary emboli. No acute abnormality is seen. Aortic Atherosclerosis (ICD10-I70.0) and Emphysema (ICD10-J43.9). Electronically Signed   By: Alcide Clever M.D.   On: 06/13/2023 00:16   DG Chest 2 View Result Date: 06/12/2023 CLINICAL DATA:  Left-sided chest pain and shortness of breath. EXAM: CHEST - 2 VIEW COMPARISON:  None Available. FINDINGS: The heart size and mediastinal contours are within normal limits. There is moderate severity calcification of the aortic arch and tortuosity of the descending thoracic aorta. Both lungs are clear. Radiopaque surgical clips are seen within the soft tissues of the left breast. The visualized skeletal structures are unremarkable. IMPRESSION: No active cardiopulmonary disease. Electronically Signed   By: Aram Candela M.D.   On: 06/12/2023 22:07   Assessment and Plan:   Chest pain Abnormal myocardial perfusion study Dyspnea on exertion Aortic atherosclerosis on CT Negative troponin x 2 CTA chest negative for PE, showed aortic atherosclerosis  EKG without acute ischemic changes  Echo from 11/2022 showed:  EF 55-60%, no LV RWMA, grade I diastolic dysfunction, normal RV, mild dilation of aortic root She denies any current chest pain, as she has remained resting in the bed  Lipid panel ordered, pending results  Updated echocardiogram ordered, pending results  Primary team ordered ASA 81 mg daily, patient has been refusing due to allergy that she described as "stomach burning" discussed the importance of her taking Christina medication and she agreed to take it  With prior abnormal stress test, typical chest pain and worsening symptoms patient will likely require cardiac catheterization, will discuss with MD  Patient prefers to get her cath done tomorrow  Orders will be placed, she will be NPO at midnight 3/4  Informed Consent   Shared Decision  Making/Informed Consent The risks [stroke (1 in 1000), death (1 in 1000), kidney failure [usually temporary] (1 in 500), bleeding (1 in 200), allergic reaction [possibly serious] (1 in 200)], benefits (diagnostic support and management of coronary artery disease) and alternatives of a cardiac catheterization were discussed in detail with Christina Sanchez and she is willing to proceed.    History of symptomatic PVCs TSH normal  Continue on telemetry Patient denies any symptomatic palpitations at Christina time Start Toprol 25 mg daily   Hypertension She was having some elevated BP readings so she was restarted on her home medication today as listed below Currently on PTA amlodipine 5 mg + benazepril 10 mg daily Start Toprol 25 mg daily   Per primary Sore throat, thrush History of breast cancer Tobacco abuse Alcohol abuse, on CIWA protocol Electrolyte imbalances   For questions or updates, please contact [facility] Please consult [email or web address] for contact info under    Signed, Olena Leatherwood, PA-C  06/13/2023 2:29 PM   Patient seen and examined. Agree with assessment and plan.  Christina Sanchez is a 73 year old African-American female who has a history of symptomatic PVCs, hypertension, hyperlipidemia, tobacco use with emphysema, as well as history of breast cancer.  June 2024 she was evaluated by Dr. Bryan Lemma.  A stress test showed a possible fixed apical inferior perfusion defect with abnormal wall motion, EF was 41%.  Subsequent echo Doppler study in August 2024 showed EF at 55 to 60%.  His only, the patient has been experiencing since episodic episodes of chest discomfort.  She states more often Christina is described as a chest heaviness and more often is precipitated with exertion.  She presented to the emergency room.  Troponins are negative.  CT angio of chest did not show any pulmonary emboli or acute abnormality.  There is aortic atherosclerosis and emphysema.  Exam,  blood pressure is elevated and when I saw the patient in ER was 148/98 but had been higher previously.  HEENT is unremarkable.  I did not hear carotid bruits.  Lungs reveal slightly decreased breath sounds without wheezing.  Rhythm was regular with 1/6 systolic murmur, 3 isolated PVC.  Abdomen was nontender.  Bowel sounds positive.  There was no significant edema.  ECG shows sinus rhythm at 86 with poor R wave progression anteriorly, and nonspecific T wave abnormality in lead aVL.  Troponins are negative.  Lipid studies show total cholesterol 159, LDL 74.  She was started on amlodipine and benazepril as her home medication.  With increased heart rate and continued blood pressure we will add Toprol-XL 25 mg daily rather than low-dose previous carvedilol.  Discussed definitive evaluation with cardiac catheterization and reviewed the risk benefits of the procedure in detail.  She prefers to have Christina study done tomorrow morning if at all possible rather than be added on for later today.   Lennette Bihari, MD, [facility] 06/13/2023 2:57 PM

## 2023-06-13 NOTE — Care Management Obs Status (Signed)
 MEDICARE OBSERVATION STATUS NOTIFICATION   Patient Details  Name: Christina Sanchez MRN: [id number] Date of Birth: September 25, 1950   Medicare Observation Status Notification Given:  Yes    Lockie Pares, RN 06/13/2023, 2:03 PM

## 2023-06-13 NOTE — H&P (Signed)
 History and Physical    Christina Sanchez BJY:[id number] DOB: Jan 14, 1951 DOA: 06/12/2023  Patient coming from: Home.  Chief Complaint: Chest pain.  HPI: Christina Sanchez is a 73 y.o. female with history of hypertension, breast cancer status postlumpectomy, tobacco abuse, centrilobular emphysema noted in the CT scan presents to the ER with complaint of chest pain.  Patient states she has been having shortness of breath on exertion for the last 2 weeks and over the last 3 days started developing exertional chest pressure retrosternal nonradiating with no associated nausea vomiting or abdominal pain.  Her chest pain happens along with palpitations.  After she takes rests it gets relieved.  In June 2024 patient had a stress test which was positive for ischemia and infarct and showed regional wall motion abnormality.  Patient had followed with Dr. Herbie Baltimore, cardiologist who had done a 2D echo in August 2024 which showed no regional wall motion abnormality.  Patient has not followed up with cardiology since then.  ED Course: In the ER troponins were negative CT angiogram of the chest was negative for PE.  EKG shows normal sinus rhythm.  Labs show sodium 131 potassium 3.4.  Potassium replacement was given.  ER physician discussed with on-call cardiologist Dr. Orson Aloe who will be requesting the morning cardiology team to see the patient.  At the time of my exam patient was chest pain-free.  Review of Systems: As per HPI, rest all negative.   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   Current every day smoker    Started smoking at age 63 (24.5 pack smoking history.  On going 1/2 PPD   Hyperlipidemia     Hypertension    MDD (major depressive disorder)    Without psychotic features.  Pending behavioral health evaluation.   Personal history of radiation therapy     Past Surgical History:  Procedure Laterality Date   BREAST LUMPECTOMY     BREAST LUMPECTOMY WITH RADIOACTIVE SEED AND SENTINEL LYMPH NODE BIOPSY Left 06/30/2021   Procedure: LEFT BREAST LUMPECTOMY WITH RADIOACTIVE SEED AND SENTINEL LYMPH NODE BIOPSY;  Surgeon: Harriette Bouillon, MD;  Location: [facility];  Service: General;  Laterality: Left;   fistula track removal     rectal   FOOT SURGERY     Lower Extremity ABIs  09/03/2021   Resting left and right ABI and TBI within normal range.  No evidence of bilateral lower extremity arterial disease.   NM MYOVIEW LTD  09/29/2022   Lexiscan - > INTERMEDIATE RISK (due to reduced systolic function and prior infarct).  Fixed apical inferior perfusion defect with abnormal wall motion consistent with infarct.  EF 30 to 44% with apical inferior hypokinesis.  NO ISCHEMIA.  Unable to walk therefore unable to assess the effect of exercise on PVCs.   PARTIAL HYSTERECTOMY     TRANSTHORACIC ECHOCARDIOGRAM  08/03/2021   Normal LV size and function.  EF 60 to 65%.  No RWMA.  GR 1 DD/impaired relaxation (normal for age).  Normal RV size and function.  Normal PAP.  Normal RAP.  Normal MV.  AoV sclerosis but no stenosis.   TRANSTHORACIC ECHOCARDIOGRAM  07/14/2022   [facility]):  Normal LV size and function.  EF 60 to 65%.  No RWMA.  Mild LVH.  G1 DD.  Normal RV.  Essentially normal valves.  Normal RV.  Normal RAP.   ZIO PATCH MONITOR  05/2022   (2/14-28/2024) (ordered by [facility]): Sinus rhythm, rate range 55 to 141 bpm.  AVG 75 bpm.  Occasional (1.8%) PVCs with rare couplets-symptomatic..  Some bigeminy and trigeminy.  Rare (<1%) isolated PACs couplets and triplets.  9 short atrial runs: Fastest-11 beats (4.3 sec), rate 133-164 bpm - Avg 155 BPM; longest 11 beats (6.5  sec), rate 72-133 bpm, Avg 110 bpm.     reports that she has been smoking cigarettes. She has a 25 pack-year smoking history. She has never used smokeless tobacco. She reports current alcohol use of about 15.0 standard drinks of alcohol per week. She reports that she does not use drugs.  Allergies  Allergen Reactions   Aspirin     "burning feeling in my stomach". She can tolerate 1 Aleve.   Latex Nausea Only   Lexapro [Escitalopram] Diarrhea    Family History  Problem Relation Age of Onset   Diabetes Brother    Migraines Neg Hx    Headache Neg Hx    Breast cancer Neg Hx     Prior to Admission medications   Medication Sig Start Date End Date Taking? Authorizing Provider  albuterol (VENTOLIN HFA) 108 (90 Base) MCG/ACT inhaler Inhale 2 puffs into the lungs as needed for wheezing or shortness of breath. 05/27/21  Yes [provider]  amLODipine-benazepril (LOTREL) 5-10 MG capsule Take 1 capsule by mouth daily. 12/02/18  Yes [provider]  fluticasone (FLONASE) 50 MCG/ACT nasal spray Place 2 sprays into both nostrils daily as needed for allergies.   Yes [provider]  mirtazapine (REMERON) 45 MG tablet Take 45 mg by mouth at bedtime. Taken at 1900   Yes [provider]  zolpidem (AMBIEN) 10 MG tablet Take 10 mg by mouth at bedtime. Taken 4 hours after the mirtazapine at 2200   Yes [provider]  carvedilol (COREG) 3.125 MG tablet Take 1 tablet (3.125 mg total) by mouth 2 (two) times daily. Patient not taking: Reported on 06/13/2023 10/20/22 01/18/23  Marykay Lex, MD    Physical Exam: Constitutional: Moderately built and nourished. Vitals:   06/13/23 0128 06/13/23 0230 06/13/23 0330 06/13/23 0400  BP: (!) 163/111 (!) 166/112 (!) 150/93 (!) 170/104  Pulse: 91     Resp: 17 (!) 21 (!) 24 19  Temp: 98.7 F (37.1 C)     TempSrc: Oral     SpO2: 100%     Weight:      Height:       Eyes: Anicteric no pallor. ENMT: No discharge from  the ears eyes nose or mouth. Neck: No mass felt.  No neck rigidity. Respiratory: No rhonchi or crepitations. Cardiovascular: S1-S2 heard. Abdomen: Soft nontender bowel sound present. Musculoskeletal: No edema. Skin: No rash. Neurologic: Alert awake oriented to time place and person.  Moves all extremities 5 x 5.  No facial asymmetry tongue is midline pupils equal reacting to light. Psychiatric: Appears normal.  Normal affect.   Labs on Admission: I have personally reviewed following labs and imaging studies  CBC: Recent Labs  Lab 06/12/23 2112  WBC 5.9  HGB 13.0  HCT 37.1  MCV 103.1*  PLT 237   Basic Metabolic Panel: Recent Labs  Lab 06/12/23 2112  NA 131*  K 3.4*  CL 93*  CO2 23  GLUCOSE 114*  BUN <5*  CREATININE 0.90  CALCIUM 9.4   GFR: Estimated Creatinine Clearance: 52.6 mL/min (by C-G formula based on SCr of 0.9 mg/dL). Liver Function Tests: Recent Labs  Lab 06/12/23 2112  AST 34  ALT 28  ALKPHOS 59  BILITOT 1.0  PROT 6.5  ALBUMIN 3.6   Recent Labs  Lab 06/12/23 2112  LIPASE 24   No results for input(s): "AMMONIA" in the last 168 hours. Coagulation Profile: No results for input(s): "INR", "PROTIME" in the last 168 hours. Cardiac Enzymes: No results for input(s): "CKTOTAL", "CKMB", "CKMBINDEX", "TROPONINI" in the last 168 hours. BNP (last 3 results) No results for input(s): "PROBNP" in the last 8760 hours. HbA1C: No results for input(s): "HGBA1C" in the last 72 hours. CBG: No results for input(s): "GLUCAP" in the last 168 hours. Lipid Profile: No results for input(s): "CHOL", "HDL", "LDLCALC", "TRIG", "CHOLHDL", "LDLDIRECT" in the last 72 hours. Thyroid Function Tests: No results for input(s): "TSH", "T4TOTAL", "FREET4", "T3FREE", "THYROIDAB" in the last 72 hours. Anemia Panel: No results for input(s): "VITAMINB12", "FOLATE", "FERRITIN", "TIBC", "IRON", "RETICCTPCT" in the last 72 hours. Urine analysis: No results found for: "COLORURINE",  "APPEARANCEUR", "LABSPEC", "PHURINE", "GLUCOSEU", "HGBUR", "BILIRUBINUR", "KETONESUR", "PROTEINUR", "UROBILINOGEN", "NITRITE", "LEUKOCYTESUR" Sepsis Labs: @LABRCNTIP (procalcitonin:4,lacticidven:4) ) Recent Results (from the past 240 hours)  Resp panel by RT-PCR (RSV, Flu A&B, Covid) Anterior Nasal Swab     Status: None   Collection Time: 06/12/23  9:12 PM   Specimen: Anterior Nasal Swab  Result Value Ref Range Status   SARS Coronavirus 2 by RT PCR NEGATIVE NEGATIVE Final   Influenza A by PCR NEGATIVE NEGATIVE Final   Influenza B by PCR NEGATIVE NEGATIVE Final    Comment: (NOTE) The Xpert Xpress SARS-CoV-2/FLU/RSV plus assay is intended as an aid in the diagnosis of influenza from Nasopharyngeal swab specimens and should not be used as a sole basis for treatment. Nasal washings and aspirates are unacceptable for Xpert Xpress SARS-CoV-2/FLU/RSV testing.  Fact Sheet for Patients: BloggerCourse.com  Fact Sheet for Healthcare Providers: SeriousBroker.it  This test is not yet approved or cleared by the [place] FDA and has been authorized for detection and/or diagnosis of SARS-CoV-2 by FDA under an Emergency Use Authorization (EUA). This EUA will remain in effect (meaning this test can be used) for the duration of the COVID-19 declaration under Section 564(b)(1) of the Act, 21 U.S.C. section 360bbb-3(b)(1), unless the authorization is terminated or revoked.     Resp Syncytial Virus by PCR NEGATIVE NEGATIVE Final    Comment: (NOTE) Fact Sheet for Patients: BloggerCourse.com  Fact Sheet for Healthcare Providers: SeriousBroker.it  This test is not yet approved or cleared by the [place] FDA and has been authorized for detection and/or diagnosis of SARS-CoV-2 by FDA under an Emergency Use Authorization (EUA). This EUA will remain in effect (meaning this test can be used) for  the duration of the COVID-19 declaration under Section 564(b)(1) of the Act, 21 U.S.C. section 360bbb-3(b)(1), unless the authorization is terminated or revoked.  Performed at [facility], [address] [place], [place] [address]   Group A Strep by PCR     Status: None   Collection Time: 06/12/23 10:25 PM   Specimen: Throat; Sterile Swab  Result Value Ref Range Status   Group A Strep by PCR NOT DETECTED NOT DETECTED Final    Comment: Performed at [facility], [address] [place], [place] [address]  Radiological Exams on Admission: CT Angio Chest PE W and/or Wo Contrast Result Date: 06/13/2023 CLINICAL DATA:  Shortness of breath and chest pain, initial encounter EXAM: CT ANGIOGRAPHY CHEST WITH CONTRAST TECHNIQUE: Multidetector CT imaging of the chest was performed using the standard protocol during bolus administration of intravenous contrast. Multiplanar CT image reconstructions and MIPs were obtained to evaluate the vascular anatomy. RADIATION DOSE REDUCTION: This exam was performed according to the departmental dose-optimization program which includes automated exposure control, adjustment of the mA and/or kV according to patient size and/or use of iterative reconstruction technique. CONTRAST:  75mL OMNIPAQUE IOHEXOL 350 MG/ML SOLN COMPARISON:  Chest x-ray from earlier in the same day. FINDINGS: Cardiovascular: Thoracic aorta and its branches show mild atherosclerotic calcifications. No aneurysmal dilatation or dissection is seen. The heart is at the upper limits of normal in size. The pulmonary artery demonstrates a normal branching pattern bilaterally. No intraluminal filling defect to suggest pulmonary embolism is noted. Mediastinum/Nodes: Thoracic inlet is within normal limits. No hilar or mediastinal adenopathy is noted. The esophagus as visualized is within normal limits. Lungs/Pleura: Mild emphysematous changes are noted. Mild scarring is noted in the left lung base  posteriorly. No focal infiltrate, effusion or parenchymal nodule is seen. Upper Abdomen: Visualized upper abdomen shows no acute abnormality. Musculoskeletal: No chest wall abnormality. No acute or significant osseous findings. Review of the MIP images confirms the above findings. IMPRESSION: No evidence of pulmonary emboli. No acute abnormality is seen. Aortic Atherosclerosis (ICD10-I70.0) and Emphysema (ICD10-J43.9). Electronically Signed   By: Alcide Clever M.D.   On: 06/13/2023 00:16   DG Chest 2 View Result Date: 06/12/2023 CLINICAL DATA:  Left-sided chest pain and shortness of breath. EXAM: CHEST - 2 VIEW COMPARISON:  None Available. FINDINGS: The heart size and mediastinal contours are within normal limits. There is moderate severity calcification of the aortic arch and tortuosity of the descending thoracic aorta. Both lungs are clear. Radiopaque surgical clips are seen within the soft tissues of the left breast. The visualized skeletal structures are unremarkable. IMPRESSION: No active cardiopulmonary disease. Electronically Signed   By: Aram Candela M.D.   On: 06/12/2023 22:07    EKG: Independently reviewed.  Normal sinus rhythm.  Assessment/Plan Principal Problem:   Chest pain Active Problems:   Malignant neoplasm of upper-outer quadrant of left breast in female, estrogen receptor positive (HCC)   Symptomatic PVCs   Essential hypertension   Hyperlipidemia   Current smoker   Hyponatremia   Hypokalemia   Macrocytosis    Chest pain -    has had a positive stress test in June 2024.  Cardiology has been consulted.  Will keep patient n.p.o. in anticipation of possible procedure.  Check 2D echo.  Aspirin.  As needed nitroglycerin. Hypertension uncontrolled continue with amlodipine and ACE inhibitor.  Follow blood pressure trends. Palpitations with history of PVCs.  Closely monitor on telemetry.  Check TSH. Tobacco and alcohol use advised about quitting.  Patient states she only drinks  wine rarely nowadays.  On CIWA. Macrocytosis could be due to alcohol use.  Check B12 and folate levels. Prior history of breast cancer status postlumpectomy. Depression on Remeron. Hypokalemia and hyponatremia cause not clear.  Potassium replacement given.  Follow metabolic panel.  Since patient has chest pain will need further workup and more than 2 midnight stay.   DVT prophylaxis: Lovenox. Code Status: Full code. Family Communication: Discussed with patient. Disposition Plan: Cardiac telemetry. Consults called: Cardiology. Admission status: Observation.

## 2023-06-13 NOTE — Discharge Instructions (Signed)

## 2023-06-13 NOTE — Progress Notes (Signed)
 CSW added substance abuse resources to patient's AVS.  Edwin Dada, MSW, LCSW Transitions of Care  Clinical Social Worker II [phone]

## 2023-06-14 ENCOUNTER — Encounter (HOSPITAL_COMMUNITY)
Admission: EM | Disposition: A | Discharge status: Home / Self Care | Payer: Self-pay | Source: Home / Self Care | Attending: Emergency Medicine

## 2023-06-14 ENCOUNTER — Telehealth (HOSPITAL_COMMUNITY): Payer: Self-pay

## 2023-06-14 ENCOUNTER — Encounter (HOSPITAL_COMMUNITY): Payer: Self-pay | Admitting: Internal Medicine

## 2023-06-14 ENCOUNTER — Observation Stay (HOSPITAL_COMMUNITY)

## 2023-06-14 ENCOUNTER — Other Ambulatory Visit (HOSPITAL_COMMUNITY): Payer: Self-pay

## 2023-06-14 DIAGNOSIS — I2089 Other forms of angina pectoris: Secondary | ICD-10-CM

## 2023-06-14 DIAGNOSIS — I25110 Atherosclerotic heart disease of native coronary artery with unstable angina pectoris: Secondary | ICD-10-CM

## 2023-06-14 DIAGNOSIS — E876 Hypokalemia: Secondary | ICD-10-CM | POA: Diagnosis not present

## 2023-06-14 DIAGNOSIS — E871 Hypo-osmolality and hyponatremia: Secondary | ICD-10-CM

## 2023-06-14 DIAGNOSIS — I10 Essential (primary) hypertension: Secondary | ICD-10-CM | POA: Diagnosis not present

## 2023-06-14 DIAGNOSIS — R079 Chest pain, unspecified: Secondary | ICD-10-CM | POA: Diagnosis not present

## 2023-06-14 DIAGNOSIS — E785 Hyperlipidemia, unspecified: Secondary | ICD-10-CM | POA: Diagnosis not present

## 2023-06-14 DIAGNOSIS — I340 Nonrheumatic mitral (valve) insufficiency: Secondary | ICD-10-CM

## 2023-06-14 DIAGNOSIS — I2081 Angina pectoris with coronary microvascular dysfunction: Secondary | ICD-10-CM | POA: Diagnosis not present

## 2023-06-14 DIAGNOSIS — Z1152 Encounter for screening for COVID-19: Secondary | ICD-10-CM | POA: Diagnosis not present

## 2023-06-14 HISTORY — PX: CORONARY PRESSURE/FFR STUDY: CATH118243

## 2023-06-14 HISTORY — PX: LEFT HEART CATH AND CORONARY ANGIOGRAPHY: CATH118249

## 2023-06-14 LAB — CBC
HCT: 36.3 % (ref 36.0–46.0)
Hemoglobin: 13 g/dL (ref 12.0–15.0)
MCH: 36 pg — ABNORMAL HIGH (ref 26.0–34.0)
MCHC: 35.8 g/dL (ref 30.0–36.0)
MCV: 100.6 fL — ABNORMAL HIGH (ref 80.0–100.0)
Platelets: 241 10*3/uL (ref 150–400)
RBC: 3.61 MIL/uL — ABNORMAL LOW (ref 3.87–5.11)
RDW: 12.4 % (ref 11.5–15.5)
WBC: 4.6 10*3/uL (ref 4.0–10.5)
nRBC: 0 % (ref 0.0–0.2)

## 2023-06-14 LAB — BASIC METABOLIC PANEL
Anion gap: 14 (ref 5–15)
BUN: 6 mg/dL — ABNORMAL LOW (ref 8–23)
CO2: 21 mmol/L — ABNORMAL LOW (ref 22–32)
Calcium: 9.2 mg/dL (ref 8.9–10.3)
Chloride: 95 mmol/L — ABNORMAL LOW (ref 98–111)
Creatinine, Ser: 0.72 mg/dL (ref 0.44–1.00)
GFR, Estimated: >60 mL/min (ref >60)
Glucose, Bld: 76 mg/dL (ref 70–99)
Potassium: 3 mmol/L — ABNORMAL LOW (ref 3.5–5.1)
Sodium: 130 mmol/L — ABNORMAL LOW (ref 135–145)

## 2023-06-14 LAB — ECHOCARDIOGRAM COMPLETE
AR max vel: 1.81 cm2
AV Area VTI: 1.67 cm2
AV Area mean vel: 1.4 cm2
AV Mean grad: 2 mmHg
AV Peak grad: 3.2 mmHg
Ao pk vel: 0.89 m/s
Area-P 1/2: 1.55 cm2
Calc EF: 45.6 %
Height: 66 in
MV M vel: 6.07 m/s
MV Peak grad: 147.4 mmHg
MV VTI: 1.66 cm2
Radius: 0.2 cm
S' Lateral: 3.2 cm
Single Plane A2C EF: 44.3 %
Single Plane A4C EF: 46.8 %
Weight: 1968.27 [oz_av]

## 2023-06-14 LAB — MAGNESIUM: Magnesium: 1.7 mg/dL (ref 1.7–2.4)

## 2023-06-14 LAB — POCT ACTIVATED CLOTTING TIME: Activated Clotting Time: 268 s

## 2023-06-14 SURGERY — LEFT HEART CATH AND CORONARY ANGIOGRAPHY
Anesthesia: LOCAL

## 2023-06-14 MED ORDER — LIDOCAINE HCL (PF) 1 % IJ SOLN
INTRAMUSCULAR | Status: DC | PRN
  Administered 2023-06-14: 2 mL

## 2023-06-14 MED ORDER — ADENOSINE 12 MG/4ML IV SOLN
INTRAVENOUS | Status: AC
  Filled 2023-06-14: qty 16

## 2023-06-14 MED ORDER — LABETALOL HCL 5 MG/ML IV SOLN: 10.0000 mg | INTRAVENOUS | Status: AC | PRN

## 2023-06-14 MED ORDER — MIDAZOLAM HCL 2 MG/2ML IJ SOLN
INTRAMUSCULAR | Status: DC | PRN
  Administered 2023-06-14: 1 mg via INTRAVENOUS

## 2023-06-14 MED ORDER — LOPERAMIDE HCL 2 MG PO CAPS: 4.0000 mg | ORAL_CAPSULE | Freq: Three times a day (TID) | ORAL | Status: DC | PRN

## 2023-06-14 MED ORDER — HEPARIN (PORCINE) IN NACL 2000-0.9 UNIT/L-% IV SOLN
INTRAVENOUS | Status: DC | PRN
  Administered 2023-06-14: 1000 mL

## 2023-06-14 MED ORDER — ACETAMINOPHEN 325 MG PO TABS: 650.0000 mg | ORAL_TABLET | ORAL | Status: DC | PRN

## 2023-06-14 MED ORDER — PANTOPRAZOLE SODIUM 40 MG PO TBEC
40.0000 mg | DELAYED_RELEASE_TABLET | Freq: Every day | ORAL | Status: DC
  Administered 2023-06-14 – 2023-06-15 (×2): 40 mg via ORAL
  Filled 2023-06-14 (×2): qty 1

## 2023-06-14 MED ORDER — LIDOCAINE HCL (PF) 1 % IJ SOLN
INTRAMUSCULAR | Status: AC
  Filled 2023-06-14: qty 30

## 2023-06-14 MED ORDER — AMLODIPINE BESYLATE 10 MG PO TABS
10.0000 mg | ORAL_TABLET | Freq: Every day | ORAL | Status: DC
  Administered 2023-06-15: 10 mg via ORAL
  Filled 2023-06-14: qty 1

## 2023-06-14 MED ORDER — IOHEXOL 350 MG/ML SOLN
INTRAVENOUS | Status: DC | PRN
  Administered 2023-06-14: 60 mL via INTRA_ARTERIAL

## 2023-06-14 MED ORDER — SODIUM CHLORIDE 0.9% FLUSH: 3.0000 mL | INTRAVENOUS | Status: DC | PRN

## 2023-06-14 MED ORDER — ONDANSETRON HCL 4 MG/2ML IJ SOLN: 4.0000 mg | Freq: Four times a day (QID) | INTRAMUSCULAR | Status: DC | PRN

## 2023-06-14 MED ORDER — ASPIRIN 81 MG PO CHEW: 81.0000 mg | CHEWABLE_TABLET | ORAL | Status: DC

## 2023-06-14 MED ORDER — VERAPAMIL HCL 2.5 MG/ML IV SOLN
INTRAVENOUS | Status: AC
  Filled 2023-06-14: qty 2

## 2023-06-14 MED ORDER — POTASSIUM CHLORIDE CRYS ER 20 MEQ PO TBCR
60.0000 meq | EXTENDED_RELEASE_TABLET | Freq: Once | ORAL | Status: AC
  Administered 2023-06-14: 60 meq via ORAL
  Filled 2023-06-14: qty 3

## 2023-06-14 MED ORDER — HEPARIN SODIUM (PORCINE) 1000 UNIT/ML IJ SOLN
INTRAMUSCULAR | Status: AC
  Filled 2023-06-14: qty 20

## 2023-06-14 MED ORDER — HYDRALAZINE HCL 20 MG/ML IJ SOLN: 10.0000 mg | INTRAMUSCULAR | Status: AC | PRN

## 2023-06-14 MED ORDER — MAGNESIUM SULFATE 2 GM/50ML IV SOLN: 2.0000 g | Freq: Once | INTRAVENOUS | Status: DC

## 2023-06-14 MED ORDER — AMLODIPINE BESYLATE 5 MG PO TABS: 5.0000 mg | ORAL_TABLET | Freq: Once | ORAL | Status: DC

## 2023-06-14 MED ORDER — VERAPAMIL HCL 2.5 MG/ML IV SOLN
INTRAVENOUS | Status: DC | PRN
  Administered 2023-06-14: 10 mL via INTRA_ARTERIAL

## 2023-06-14 MED ORDER — BENAZEPRIL HCL 5 MG PO TABS
10.0000 mg | ORAL_TABLET | Freq: Every day | ORAL | Status: DC
Start: 2023-06-15 — End: 2023-06-15
  Administered 2023-06-15: 10 mg via ORAL
  Filled 2023-06-14: qty 2

## 2023-06-14 MED ORDER — MIDAZOLAM HCL 2 MG/2ML IJ SOLN
INTRAMUSCULAR | Status: AC
  Filled 2023-06-14: qty 2

## 2023-06-14 MED ORDER — SODIUM CHLORIDE 0.9 % WEIGHT BASED INFUSION
1.0000 mL/kg/h | INTRAVENOUS | Status: DC
  Administered 2023-06-14: 1 mL/kg/h via INTRAVENOUS

## 2023-06-14 MED ORDER — SODIUM CHLORIDE 0.9% FLUSH
3.0000 mL | Freq: Two times a day (BID) | INTRAVENOUS | Status: DC
Start: 2023-06-14 — End: 2023-06-15
  Administered 2023-06-14 – 2023-06-15 (×2): 3 mL via INTRAVENOUS

## 2023-06-14 MED ORDER — ASPIRIN 81 MG PO CHEW
81.0000 mg | CHEWABLE_TABLET | ORAL | Status: AC
  Administered 2023-06-14: 81 mg via ORAL
  Filled 2023-06-14: qty 1

## 2023-06-14 MED ORDER — ROSUVASTATIN CALCIUM 20 MG PO TABS
20.0000 mg | ORAL_TABLET | Freq: Every day | ORAL | Status: DC
  Administered 2023-06-15: 20 mg via ORAL
  Filled 2023-06-14: qty 1

## 2023-06-14 MED ORDER — HEPARIN SODIUM (PORCINE) 1000 UNIT/ML IJ SOLN
INTRAMUSCULAR | Status: DC | PRN
  Administered 2023-06-14: 1000 [IU] via INTRA_ARTERIAL
  Administered 2023-06-14: 5000 [IU] via INTRA_ARTERIAL

## 2023-06-14 MED ORDER — SODIUM CHLORIDE 0.9 % IV SOLN: 250.0000 mL | INTRAVENOUS | Status: AC | PRN

## 2023-06-14 MED ORDER — ADENOSINE (DIAGNOSTIC) 140MCG/KG/MIN
INTRAVENOUS | Status: DC | PRN
  Administered 2023-06-14: 140 ug/kg/min via INTRAVENOUS

## 2023-06-14 MED ORDER — FENTANYL CITRATE (PF) 100 MCG/2ML IJ SOLN
INTRAMUSCULAR | Status: AC
  Filled 2023-06-14: qty 2

## 2023-06-14 MED ORDER — FENTANYL CITRATE (PF) 100 MCG/2ML IJ SOLN
INTRAMUSCULAR | Status: DC | PRN
  Administered 2023-06-14: 25 ug via INTRAVENOUS

## 2023-06-14 SURGICAL SUPPLY — 10 items
CATH INFINITI 5FR ANG PIGTAIL (CATHETERS) IMPLANT
CATH INFINITI AMBI 6FR TG (CATHETERS) IMPLANT
CATH LAUNCHER 6FR EBU3.5 (CATHETERS) IMPLANT
DEVICE RAD TR BAND REGULAR (VASCULAR PRODUCTS) IMPLANT
GLIDESHEATH SLEND SS 6F .021 (SHEATH) IMPLANT
GUIDEWIRE PRESSURE X 175 (WIRE) IMPLANT
KIT HEMO VALVE WATCHDOG (MISCELLANEOUS) IMPLANT
PACK CARDIAC CATHETERIZATION (CUSTOM PROCEDURE TRAY) ×1 IMPLANT
SET ATX-X65L (MISCELLANEOUS) IMPLANT
WIRE EMERALD 3MM-J .035X260CM (WIRE) IMPLANT

## 2023-06-14 NOTE — Progress Notes (Signed)
 [facility] PROGRESS NOTE   Christina Sanchez ZOX:[id number] DOB: February 18, 1951 DOA: 06/12/2023  PCP: Sharmon Revere, MD  Brief History: 73 y.o. female with history of hypertension, breast cancer status postlumpectomy, tobacco abuse, centrilobular emphysema noted in the CT scan presented to the ER with complaint of chest pain.  She had a stress test in 2024 which was positive for infarct.  She underwent echocardiogram which showed normal LVEF without any wall motion abnormalities.  It appears that there was a plan to do CT coronary but patient has not undergone the study.  Looks like she was lost to follow-up.  Patient hospitalized for further management.  Consultants: Cardiology  Procedures: None yet    Subjective/Interval History: Patient denies any chest pain.  Her sore throat has also improved.  No complaints offered this morning.  Slightly nervous about her cardiac catheterization.      Assessment/Plan:  Chest pain Etiology unclear.  CT angiogram was negative for PE.  Concern is for angina.  EKG however without any obvious ischemic changes.  Nonspecific T wave changes noted.  Troponins have been normal. Echocardiogram is pending. LDL is 74.  Patient is on statin. Patient is on aspirin as well. Patient seen by cardiology.  Plan is for cardiac catheterization today.  Essential hypertension Continue with home medications including amlodipine and ACE inhibitor.  Occasional high readings noted.  Continue to monitor and adjust medication doses as indicated.  Sore throat Strep was negative.  Chloraseptic Spray and mouthwash will be ordered.  No lesions on oral examination.  History of palpitations with history of PVCs Monitor on telemetry.  TSH is normal.  Macrocytosis/folic acid deficiency B12 level is 488.  Folic acid is low was 3.2.  Started on folic acid supplementation  Hypokalemia/hyponatremia Supplement potassium.  Magnesium is 1.7.  Sodium level noted to be  lower today.  Recheck labs tomorrow.  History of breast cancer s/p lumpectomy Outpatient management.  Tobacco and alcohol abuse CIWA protocol.  Counseled.   DVT Prophylaxis: Lovenox Code Status: Full code Family Communication: Discussed with patient Disposition Plan: Hopefully return home when improved     Medications: Scheduled:  amLODipine  5 mg Oral Daily   And   benazepril  10 mg Oral Daily   aspirin  81 mg Oral Pre-Cath   aspirin EC  81 mg Oral Daily   enoxaparin (LOVENOX) injection  40 mg Subcutaneous Q24H   folic acid  1 mg Oral Daily   metoprolol succinate  25 mg Oral Daily   mirtazapine  45 mg Oral QHS   multivitamin with minerals  1 tablet Oral Daily   pantoprazole  40 mg Oral Daily   potassium chloride  60 mEq Oral Once   thiamine  100 mg Oral Daily   Or   thiamine  100 mg Intravenous Daily   zolpidem  5 mg Oral QHS   Continuous:  sodium chloride 1 mL/kg/hr (06/14/23 0704)   WJX:BJYNWGNFA, loperamide, LORazepam **OR** LORazepam, magic mouthwash, phenol   Objective:  Vital Signs  Vitals:   06/14/23 0830 06/14/23 0835 06/14/23 0855 06/14/23 0900  BP: (!) 149/93     Pulse:      Resp: 17 15 17 19   Temp: 98.9 F (37.2 C)     TempSrc: Oral     SpO2: 100%     Weight:      Height:        Intake/Output Summary (Last 24 hours) at 06/14/2023 1031 Last data filed at 06/13/2023 1807 Gross  per 24 hour  Intake 60 ml  Output --  Net 60 ml   Filed Weights   06/12/23 2059 06/14/23 0453  Weight: 59 kg 55.8 kg    General appearance: Awake alert.  In no distress Resp: Clear to auscultation bilaterally.  Normal effort Cardio: S1-S2 is normal regular.  No S3-S4.  No rubs murmurs or bruit GI: Abdomen is soft.  Nontender nondistended.  Bowel sounds are present normal.  No masses organomegaly Extremities: No edema.  Full range of motion of lower extremities. Neurologic: Alert and oriented x3.  No focal neurological deficits.     Lab Results:  Data  Reviewed: I have personally reviewed following labs and reports of the imaging studies  CBC: Recent Labs  Lab 06/12/23 2112 06/13/23 0525 06/14/23 0346  WBC 5.9 5.8 4.6  HGB 13.0 12.6 13.0  HCT 37.1 35.3* 36.3  MCV 103.1* 102.3* 100.6*  PLT 237 209 241    Basic Metabolic Panel: Recent Labs  Lab 06/12/23 2112 06/13/23 0525 06/14/23 0346  NA 131* 132* 130*  K 3.4* 3.5 3.0*  CL 93* 95* 95*  CO2 23 24 21*  GLUCOSE 114* 100* 76  BUN <5* 5* 6*  CREATININE 0.90 0.78 0.72  CALCIUM 9.4 9.3 9.2  MG  --   --  1.7    GFR: Estimated Creatinine Clearance: 56 mL/min (by C-G formula based on SCr of 0.72 mg/dL).  Liver Function Tests: Recent Labs  Lab 06/12/23 2112  AST 34  ALT 28  ALKPHOS 59  BILITOT 1.0  PROT 6.5  ALBUMIN 3.6    Recent Labs  Lab 06/12/23 2112  LIPASE 24    Thyroid Function Tests: Recent Labs    06/13/23 0525  TSH 3.420    Anemia Panel: Recent Labs    06/13/23 0525  VITAMINB12 488  FOLATE 3.2*    Recent Results (from the past 240 hours)  Resp panel by RT-PCR (RSV, Flu A&B, Covid) Anterior Nasal Swab     Status: None   Collection Time: 06/12/23  9:12 PM   Specimen: Anterior Nasal Swab  Result Value Ref Range Status   SARS Coronavirus 2 by RT PCR NEGATIVE NEGATIVE Final   Influenza A by PCR NEGATIVE NEGATIVE Final   Influenza B by PCR NEGATIVE NEGATIVE Final    Comment: (NOTE) The Xpert Xpress SARS-CoV-2/FLU/RSV plus assay is intended as an aid in the diagnosis of influenza from Nasopharyngeal swab specimens and should not be used as a sole basis for treatment. Nasal washings and aspirates are unacceptable for Xpert Xpress SARS-CoV-2/FLU/RSV testing.  Fact Sheet for Patients: BloggerCourse.com  Fact Sheet for Healthcare Providers: SeriousBroker.it  This test is not yet approved or cleared by the [place] FDA and has been authorized for detection and/or diagnosis of SARS-CoV-2  by FDA under an Emergency Use Authorization (EUA). This EUA will remain in effect (meaning this test can be used) for the duration of the COVID-19 declaration under Section 564(b)(1) of the Act, 21 U.S.C. section 360bbb-3(b)(1), unless the authorization is terminated or revoked.     Resp Syncytial Virus by PCR NEGATIVE NEGATIVE Final    Comment: (NOTE) Fact Sheet for Patients: BloggerCourse.com  Fact Sheet for Healthcare Providers: SeriousBroker.it  This test is not yet approved or cleared by the [place] FDA and has been authorized for detection and/or diagnosis of SARS-CoV-2 by FDA under an Emergency Use Authorization (EUA). This EUA will remain in effect (meaning this test can be used) for the duration of  the COVID-19 declaration under Section 564(b)(1) of the Act, 21 U.S.C. section 360bbb-3(b)(1), unless the authorization is terminated or revoked.  Performed at [facility], [address], [place], [place] [address]   Group A Strep by PCR     Status: None   Collection Time: 06/12/23 10:25 PM   Specimen: Throat; Sterile Swab  Result Value Ref Range Status   Group A Strep by PCR NOT DETECTED NOT DETECTED Final    Comment: Performed at [facility], [address] [place], [place] [address]      Radiology Studies: CT Angio Chest PE W and/or Wo Contrast Result Date: 06/13/2023 CLINICAL DATA:  Shortness of breath and chest pain, initial encounter EXAM: CT ANGIOGRAPHY CHEST WITH CONTRAST TECHNIQUE: Multidetector CT imaging of the chest was performed using the standard protocol during bolus administration of intravenous contrast. Multiplanar CT image reconstructions and MIPs were obtained to evaluate the vascular anatomy. RADIATION DOSE REDUCTION: This exam was performed according to the departmental dose-optimization program which includes automated exposure control, adjustment of the mA and/or kV according to  patient size and/or use of iterative reconstruction technique. CONTRAST:  75mL OMNIPAQUE IOHEXOL 350 MG/ML SOLN COMPARISON:  Chest x-ray from earlier in the same day. FINDINGS: Cardiovascular: Thoracic aorta and its branches show mild atherosclerotic calcifications. No aneurysmal dilatation or dissection is seen. The heart is at the upper limits of normal in size. The pulmonary artery demonstrates a normal branching pattern bilaterally. No intraluminal filling defect to suggest pulmonary embolism is noted. Mediastinum/Nodes: Thoracic inlet is within normal limits. No hilar or mediastinal adenopathy is noted. The esophagus as visualized is within normal limits. Lungs/Pleura: Mild emphysematous changes are noted. Mild scarring is noted in the left lung base posteriorly. No focal infiltrate, effusion or parenchymal nodule is seen. Upper Abdomen: Visualized upper abdomen shows no acute abnormality. Musculoskeletal: No chest wall abnormality. No acute or significant osseous findings. Review of the MIP images confirms the above findings. IMPRESSION: No evidence of pulmonary emboli. No acute abnormality is seen. Aortic Atherosclerosis (ICD10-I70.0) and Emphysema (ICD10-J43.9). Electronically Signed   By: Alcide Clever M.D.   On: 06/13/2023 00:16   DG Chest 2 View Result Date: 06/12/2023 CLINICAL DATA:  Left-sided chest pain and shortness of breath. EXAM: CHEST - 2 VIEW COMPARISON:  None Available. FINDINGS: The heart size and mediastinal contours are within normal limits. There is moderate severity calcification of the aortic arch and tortuosity of the descending thoracic aorta. Both lungs are clear. Radiopaque surgical clips are seen within the soft tissues of the left breast. The visualized skeletal structures are unremarkable. IMPRESSION: No active cardiopulmonary disease. Electronically Signed   By: Aram Candela M.D.   On: 06/12/2023 22:07       LOS: 0 days   Christina Sanchez Rito Ehrlich  [facility] Pager on  [email or web address]  06/14/2023, 10:31 AM

## 2023-06-14 NOTE — Progress Notes (Signed)
 TR band has been removed. Site is level zero, gauze and Tegaderm applied to site. Patient educated on limitations of using her right arm.

## 2023-06-14 NOTE — Telephone Encounter (Signed)
 Pharmacy Patient Advocate Encounter  Insurance verification completed.    The patient is insured through [facility]. Patient has Medicare and is not eligible for a copay card, but may be able to apply for patient assistance or Medicare RX Payment Plan (Patient Must reach out to their plan, if eligible for payment plan), if available.    Ran test claim for Entresto 24-26 and the current 30 day co-pay is $12.15. Ran test claim for Jardiance 10 and the current 30 day co-pay is $12.15.   This test claim was processed through [facility]- copay amounts may vary at other pharmacies due to pharmacy/plan contracts, or as the patient moves through the different stages of their insurance plan.

## 2023-06-14 NOTE — Interval H&P Note (Signed)
 History and Physical Interval Note:  06/14/2023 6:52 AM  Christina Sanchez  has presented today for surgery, with the diagnosis of chest pain.  The various methods of treatment have been discussed with the patient and family. After consideration of risks, benefits and other options for treatment, the patient has consented to  Procedure(s): LEFT HEART CATH AND CORONARY ANGIOGRAPHY (N/A) as a surgical intervention.  The patient's history has been reviewed, patient examined, no change in status, stable for surgery.  I have reviewed the patient's chart and labs.  Questions were answered to the patient's satisfaction.     [name]

## 2023-06-14 NOTE — Progress Notes (Signed)
  Echocardiogram 2D Echocardiogram has been performed.  Ocie Doyne RDCS 06/14/2023, 10:54 AM

## 2023-06-14 NOTE — Plan of Care (Signed)

## 2023-06-14 NOTE — Progress Notes (Addendum)
   Patient Name: Christina Sanchez Date of Encounter: 06/14/2023 [facility] Cardiologist: Bryan Lemma, MD   Interval Summary  .    No chest pain this morning, planned for cardiac cath today.   Vital Signs .    Vitals:   06/14/23 0830 06/14/23 0835 06/14/23 0855 06/14/23 0900  BP: (!) 149/93     Pulse:      Resp: 17 15 17 19   Temp: 98.9 F (37.2 C)     TempSrc: Oral     SpO2: 100%     Weight:      Height:        Intake/Output Summary (Last 24 hours) at 06/14/2023 1151 Last data filed at 06/13/2023 1807 Gross per 24 hour  Intake 60 ml  Output --  Net 60 ml      06/14/2023    4:53 AM 06/12/2023    8:59 PM 10/20/2022   10:33 AM  Last 3 Weights  Weight (lbs) 123 lb 0.3 oz 130 lb 138 lb  Weight (kg) 55.8 kg 58.968 kg 62.596 kg      Telemetry/ECG    Sinus Rhythm, PVCs - Personally Reviewed  Physical Exam .    GEN: No acute distress.   Neck: No JVD Cardiac: RRR, no murmurs, rubs, or gallops.  Respiratory: Clear to auscultation bilaterally. GI: Soft, nontender, non-distended  MS: No edema  Assessment & Plan .     73 y.o. female with a hx of symptomatic PVCs, hypertension, hyperlipidemia, current smoker,emphysema, history of breast cancer, with a history of an abnormal myocardial perfusion study (09/2022), who was seen 06/13/2023 for the evaluation of chest pain at the request of Dr. Rito Ehrlich.   Chest pain Dyspnea on exertion -- aortic atherosclerosis on CT, hsTn negative x2. EKG without acute ST/T wave changes -- prior abnormal stress testing and recurrent chest pain, therefore planned for cardiac cath -- continue ASA, add statin -- echo pending  HTN -- BP remains elevated this morning -- increase amlodipine to 10mg  daily, continue benazepril 10mg  daily, Toprol 25mg  daily  Hx of symptomatic PVCs -- occ PVCs noted on telemetry -- K+ 3.0, Mag 1.7, supp -- continue BB  HLD -- aortic atherosclerosis on CT, HDL 74 -- add Crestor 20mg  daily   For  questions or updates, please contact [facility] Please consult [email or web address] for contact info under        Signed, Laverda Page, NP    Patient seen and examined. Agree with assessment and plan.  Echo today shows EF 45 to 50% (47% x 3 D volume).  There is mild global hypokinesis, grade 1 diastolic dysfunction, and mild mitral regurgitation.  Cardiac catheterization performed showed minimal nonobstructive CAD.  There was evidence for coronary microvascular dysfunction with CFR at 1.5 (normal greater than 2-2.5) and IMI of 75 (normal less than 25).  No chest pain currently on amlodipine, benazepril, metoprolol and rosuvastatin in addition to pantoprazole.  If recurrent chest pain symptomatology, question trial of ranolazine with microvascular angina.   Lennette Bihari, MD, [facility] 06/14/2023 3:30 PM

## 2023-06-15 ENCOUNTER — Other Ambulatory Visit (HOSPITAL_COMMUNITY): Payer: Self-pay

## 2023-06-15 DIAGNOSIS — E785 Hyperlipidemia, unspecified: Secondary | ICD-10-CM | POA: Diagnosis not present

## 2023-06-15 DIAGNOSIS — I5032 Chronic diastolic (congestive) heart failure: Secondary | ICD-10-CM

## 2023-06-15 DIAGNOSIS — E876 Hypokalemia: Secondary | ICD-10-CM | POA: Diagnosis not present

## 2023-06-15 DIAGNOSIS — R0789 Other chest pain: Secondary | ICD-10-CM | POA: Diagnosis not present

## 2023-06-15 DIAGNOSIS — R079 Chest pain, unspecified: Secondary | ICD-10-CM | POA: Diagnosis not present

## 2023-06-15 DIAGNOSIS — I2089 Other forms of angina pectoris: Secondary | ICD-10-CM | POA: Diagnosis not present

## 2023-06-15 DIAGNOSIS — I10 Essential (primary) hypertension: Secondary | ICD-10-CM | POA: Diagnosis not present

## 2023-06-15 DIAGNOSIS — Z170 Estrogen receptor positive status [ER+]: Secondary | ICD-10-CM

## 2023-06-15 DIAGNOSIS — F17200 Nicotine dependence, unspecified, uncomplicated: Secondary | ICD-10-CM

## 2023-06-15 DIAGNOSIS — J439 Emphysema, unspecified: Secondary | ICD-10-CM

## 2023-06-15 DIAGNOSIS — C50412 Malignant neoplasm of upper-outer quadrant of left female breast: Secondary | ICD-10-CM

## 2023-06-15 LAB — BASIC METABOLIC PANEL
Anion gap: 12 (ref 5–15)
BUN: 6 mg/dL — ABNORMAL LOW (ref 8–23)
CO2: 23 mmol/L (ref 22–32)
Calcium: 9.4 mg/dL (ref 8.9–10.3)
Chloride: 95 mmol/L — ABNORMAL LOW (ref 98–111)
Creatinine, Ser: 0.67 mg/dL (ref 0.44–1.00)
GFR, Estimated: >60 mL/min (ref >60)
Glucose, Bld: 86 mg/dL (ref 70–99)
Potassium: 3.7 mmol/L (ref 3.5–5.1)
Sodium: 130 mmol/L — ABNORMAL LOW (ref 135–145)

## 2023-06-15 MED ORDER — METOPROLOL SUCCINATE ER 25 MG PO TB24
25.0000 mg | ORAL_TABLET | Freq: Once | ORAL | Status: AC
  Administered 2023-06-15: 25 mg via ORAL
  Filled 2023-06-15: qty 1

## 2023-06-15 MED ORDER — METOPROLOL SUCCINATE ER 50 MG PO TB24: 50.0000 mg | ORAL_TABLET | Freq: Every day | ORAL | Status: DC

## 2023-06-15 MED ORDER — EMPAGLIFLOZIN 10 MG PO TABS
10.0000 mg | ORAL_TABLET | Freq: Every day | ORAL | Status: DC
  Administered 2023-06-15: 10 mg via ORAL
  Filled 2023-06-15: qty 1

## 2023-06-15 MED ORDER — ROSUVASTATIN CALCIUM 20 MG PO TABS
20.0000 mg | ORAL_TABLET | Freq: Every day | ORAL | 0 refills | Status: AC
  Filled 2023-06-15: qty 30, 30d supply, fill #0

## 2023-06-15 MED ORDER — METOPROLOL SUCCINATE ER 50 MG PO TB24
50.0000 mg | ORAL_TABLET | Freq: Every day | ORAL | 0 refills | Status: AC
  Filled 2023-06-15: qty 30, 30d supply, fill #0

## 2023-06-15 MED ORDER — ASPIRIN 81 MG PO TBEC
81.0000 mg | DELAYED_RELEASE_TABLET | Freq: Every day | ORAL | 12 refills | Status: AC
  Filled 2023-06-15: qty 30, 30d supply, fill #0

## 2023-06-15 MED ORDER — MAGNESIUM SULFATE 2 GM/50ML IV SOLN
2.0000 g | Freq: Once | INTRAVENOUS | Status: AC
  Administered 2023-06-15: 2 g via INTRAVENOUS
  Filled 2023-06-15: qty 50

## 2023-06-15 MED ORDER — EMPAGLIFLOZIN 10 MG PO TABS
10.0000 mg | ORAL_TABLET | Freq: Every day | ORAL | 0 refills | Status: AC
  Filled 2023-06-15: qty 30, 30d supply, fill #0

## 2023-06-15 MED ORDER — MAGIC MOUTHWASH
15.0000 mL | Freq: Three times a day (TID) | ORAL | 0 refills | Status: AC | PRN
  Filled 2023-06-15: qty 240, 6d supply, fill #0

## 2023-06-15 NOTE — Progress Notes (Signed)
 AVS reviewed with patient, all questions answered. IV removed with no complications. Son to transport home. TOC meds not ready yet, patient insistent on discharging and coming back for TOC meds, nursing instructed her how to pick them up once she returns this afternoon. Patient going to DC lounge while awaiting son for transport.

## 2023-06-15 NOTE — Hospital Course (Addendum)
 Christina Sanchez was admitted to the hospital with the working diagnosis of chest pain.  73 yo female with the past medical history of hypertension, emphysema,  tobacco abuse and breast cancer who presented with chest pain and dyspnea. Reported 2 weeks of dyspnea on exertion, and 3 days of chest pain with exertion, that prompted her to come to the hospital. On her initial physical examination her blood pressure was 163/111, HR 91, RR 21 and 02 saturation 100%, lungs with no wheezing or rales, heart with S1 and S2 present and regular with no gallops, rubs or murmurs, abdomen with no distention and no lower extremity edema.   Na 131, K 3,4 Cl 93 bicarbonate 23, glucose 114, bun <5 cr 0,90  High sensitive troponin 8 and 7  Wbc 5,9 hgb 13.0 plt 237  Sars covid 19 negative Influenza negative  Urine analysis SG <1,005, protein negative, trace hgb, negative leukocytes.   Chest radiograph with no cardiomegaly, no effusions or infiltrates. Hyper-lucid lung fields.   CT chest with no evidence of pulmonary embolism, mild emphysematous changes with mild scarring on the left base posteriorly.   EKG 112 bpm, normal axis, normal intervals, sinus rhythm with no significant ST segment or T wave changes.   Patient had coronary angiography, noted microvascular disease.  Recommended medical therapy.  Follow up with primary care and cardiology as outpatient.

## 2023-06-15 NOTE — TOC CM/SW Note (Signed)
 Transition of Care [facility]) - Inpatient Brief Assessment   Patient Details  Name: Christina Sanchez MRN: [id number] Date of Birth: 12-23-1950  [facility] CM/SW Contact:    Leone Haven, RN Phone Number: 06/15/2023, 2:42 PM   Clinical Narrative: From home with son, has PCP and insurance on file, states has no HH services in place at this time , uses a cane at home.  States family member will transport them home at [employer] and family is support system, states gets medications from [facility] on [address]. Pta self ambulatory with cane.    Transition of Care Asessment: Insurance and Status: Insurance coverage has been reviewed Patient has primary care physician: Yes Home environment has been reviewed: lives with son in apartment Prior level of function:: ambulates with a cane Prior/Current Home Services: No current home services Social Drivers of Health Review: SDOH reviewed no interventions necessary Readmission risk has been reviewed: Yes Transition of care needs: no transition of care needs at this time

## 2023-06-15 NOTE — Discharge Summary (Signed)
 Physician Discharge Summary   Christina: Christina Sanchez MRN: [id number] DOB: July 13, 1950  Admit date:     06/12/2023  Discharge date: 06/15/23  Discharge Physician: York Ram Longino Trefz   PCP: Sharmon Revere, MD   Recommendations at discharge:    Christina has been placed on aspirin, statin and metoprolol for coronary artery disease.  Added SGLT 2 inh for diastolic heart failure. If recurrent angina symptoms to consider adding ranolazine 500 mg bid Follow up with Dr Delia Chimes in 7 to 10 days Follow up with Cardiology as scheduled.   Discharge Diagnoses: Principal Problem:   Microvascular angina (HCC) Active Problems:   Essential hypertension   Hyperlipidemia with target LDL less than 70   Hypokalemia   Current smoker   Chronic diastolic CHF (congestive heart failure) (HCC)   Malignant neoplasm of upper-outer quadrant of left breast in female, estrogen receptor positive (HCC)   Pulmonary emphysema (HCC)  Resolved Problems:   * No resolved hospital problems. [facility] Course: Christina Sanchez was admitted to the hospital with the working diagnosis of chest pain.  73 yo female with the past medical history of hypertension, emphysema,  tobacco abuse and breast cancer who presented with chest pain and dyspnea. Reported 2 weeks of dyspnea on exertion, and 3 days of chest pain with exertion, that prompted her to come to the hospital. On her initial physical examination her blood pressure was 163/111, HR 91, RR 21 and 02 saturation 100%, lungs with no wheezing or rales, heart with S1 and S2 present and regular with no gallops, rubs or murmurs, abdomen with no distention and no lower extremity edema.   Na 131, K 3,4 Cl 93 bicarbonate 23, glucose 114, bun <5 cr 0,90  High sensitive troponin 8 and 7  Wbc 5,9 hgb 13.0 plt 237  Sars covid 19 negative Influenza negative  Urine analysis SG <1,005, protein negative, trace hgb, negative leukocytes.   Chest radiograph with no cardiomegaly, no  effusions or infiltrates. Hyper-lucid lung fields.   CT chest with no evidence of pulmonary embolism, mild emphysematous changes with mild scarring on the left base posteriorly.   EKG 112 bpm, normal axis, normal intervals, sinus rhythm with no significant ST segment or T wave changes.   Christina had coronary angiography, noted microvascular disease.  Recommended medical therapy.  Follow up with primary care and cardiology as outpatient.    Assessment and Plan: * Microvascular angina [facility]) Cardiac catheterization with minimal non obstructive coronary artery disease.  Evidence of coronary microvascular dysfunction with CFR of 1,5 with normal being greater than 2 to 2,5 and IMR of 75 with normal being less than 25.   Christina had no further angina type chest pain during her hospitalization.  Plan to continue medical therapy with aspirin and statin. Metoprolol and ACE inh for blood pressure control.  If recurrent angina symptoms to consider Ranolazine 500 mg bid.    Essential hypertension Continue blood pressure control with amlodipine, metoprolol and benazepril.  Follow up as outpatient.   Hyperlipidemia with target LDL less than 70 Continue statin therapy.   Hypokalemia Hyponatremia.   At the time of her discharge her K is 3,7 with serum bicarbonate at 23 and cr 0,67 BUN 6, Na 130  and Mg 1,7   Christina will continue close follow up renal function and electrolytes as outpatient.   Current smoker Smoking cessation counseling.   Alcohol abuse, she has no signs of acute withdrawal.   Chronic diastolic CHF (congestive heart failure) (HCC)  Echocardiogram with mild reduction in LV systolic function with EF 45 to 50%. Global hypokinesis, RV systolic function is preserved, no significant valvular disease.   Exacerbation has been ruled out.  Christina will continue metoprolol and benazepril.  Added SGLT 2 inh.  Follow up as outpatient.   Malignant neoplasm of upper-outer quadrant  of left breast in female, estrogen receptor positive (HCC) Follow up as outpatient.   Anemia of chronic disease.  Low folic acid, she will continue supplementation as outpatient.  Follow up levels as outpatient.   Pulmonary emphysema (HCC) No signs of exacerbation, continue with bronchodilator therapy.          Consultants: cardiology  Procedures performed: cardiac catheterization   Disposition: Home Diet recommendation:  Cardiac diet DISCHARGE MEDICATION: Allergies as of 06/15/2023       Reactions   Aspirin    "burning feeling in my stomach" with 325mg  dose. She can tolerate aspirin 81mg . Also can tolerate 1 Aleve.   Latex Nausea Only   Lexapro [escitalopram] Diarrhea        Medication List     STOP taking these medications    carvedilol 3.125 MG tablet Commonly known as: COREG       TAKE these medications    albuterol 108 (90 Base) MCG/ACT inhaler Commonly known as: VENTOLIN HFA Inhale 2 puffs into the lungs as needed for wheezing or shortness of breath.   amLODipine-benazepril 5-10 MG capsule Commonly known as: LOTREL Take 1 capsule by mouth daily.   aspirin EC 81 MG tablet Take 1 tablet (81 mg total) by mouth daily. Swallow whole. Start taking on: June 16, 2023   empagliflozin 10 MG Tabs tablet Commonly known as: JARDIANCE Take 1 tablet (10 mg total) by mouth daily. Start taking on: June 16, 2023   fluticasone 50 MCG/ACT nasal spray Commonly known as: FLONASE Place 2 sprays into both nostrils daily as needed for allergies.   magic mouthwash Soln Take 15 mLs by mouth 3 (three) times daily as needed for mouth pain. Suspension contains equal amounts of Maalox Extra Strength, nystatin, and diphenhydramine.   metoprolol succinate 50 MG 24 hr tablet Commonly known as: TOPROL-XL Take 1 tablet (50 mg total) by mouth daily. Take with or immediately following a meal. Start taking on: June 16, 2023   mirtazapine 45 MG tablet Commonly known as:  REMERON Take 45 mg by mouth at bedtime. Taken at 1900   rosuvastatin 20 MG tablet Commonly known as: CRESTOR Take 1 tablet (20 mg total) by mouth daily. Start taking on: June 16, 2023   zolpidem 10 MG tablet Commonly known as: AMBIEN Take 10 mg by mouth at bedtime. Taken 4 hours after the mirtazapine at 2200        Discharge Exam: Filed Weights   06/12/23 2059 06/14/23 0453 06/15/23 0437  Weight: 59 kg 55.8 kg 58.5 kg   BP (!) 152/97   Pulse 77   Temp 98.3 F (36.8 C) (Oral)   Resp 16   Ht 5\' 6"  (1.676 m)   Wt 58.5 kg   SpO2 98%   BMI 20.82 kg/m   Christina is feeling well, no dyspnea, PND or orthopnea, no angina.   Neurology awake and alert ENT with no pallor Cardiovascular with S1 and S2 present and regular with no gallops, rubs or murmurs Respiratory with no rales or wheezing Abdomen with no distention   Condition at discharge: stable  The results of significant diagnostics from this hospitalization (including imaging, microbiology, ancillary  and laboratory) are listed below for reference.   Imaging Studies: CARDIAC CATHETERIZATION Result Date: 06/14/2023 1.  Minimal nonobstructive coronary artery disease. 2.  LVEDP of 6 mmHg. 3.  Evidence for coronary microvascular dysfunction with a CFR of 1.5 with normal being greater than 2-2.5 and IMR of 75 with normal being less than 25. Recommendation: Medical therapy.   ECHOCARDIOGRAM COMPLETE Result Date: 06/14/2023    ECHOCARDIOGRAM REPORT   Christina Name:   BREALYNN Sanchez Date of Exam: 06/14/2023 Medical Rec #:  [id number]          Height:       66.0 in Accession #:    [id number]         Weight:       123.0 lb Date of Birth:  31-Mar-1951          BSA:          1.627 m Christina Age:    72 years           BP:           149/93 mmHg Christina Gender: F                  HR:           76 bpm. Exam Location:  Inpatient Procedure: 2D Echo, 3D Echo, Cardiac Doppler, Color Doppler and Saline Contrast            Bubble Study (Both Spectral  and Color Flow Doppler were utilized            during procedure). Indications:    Chest pain  History:        Christina has prior history of Echocardiogram examinations, most                 recent 12/08/2022. Arrythmias:PVC, Signs/Symptoms:Dyspnea; Risk                 Factors:Current Smoker, Dyslipidemia and Hypertension.  Sonographer:    Vern Claude Referring Phys: [id number] TAYLOR A PARCELLS IMPRESSIONS  1. Left ventricular ejection fraction, by estimation, is 45 to 50%. Left ventricular ejection fraction by 3D volume is 47 %. The left ventricle has mildly decreased function. The left ventricle demonstrates global hypokinesis. Left ventricular diastolic  parameters are consistent with Grade I diastolic dysfunction (impaired relaxation).  2. Right ventricular systolic function is normal. The right ventricular size is normal. Tricuspid regurgitation signal is inadequate for assessing PA pressure.  3. The mitral valve is grossly normal. Mild mitral valve regurgitation. No evidence of mitral stenosis.  4. The aortic valve is grossly normal. Aortic valve regurgitation is not visualized. No aortic stenosis is present.  5. The inferior vena cava is normal in size with greater than 50% respiratory variability, suggesting right atrial pressure of 3 mmHg. Comparison(s): Changes from prior study are noted. The left ventricular function is worsened. FINDINGS  Left Ventricle: Left ventricular ejection fraction, by estimation, is 45 to 50%. Left ventricular ejection fraction by 3D volume is 47 %. The left ventricle has mildly decreased function. The left ventricle demonstrates global hypokinesis. Global longitudinal strain performed but not reported based on interpreter judgement due to suboptimal tracking. The left ventricular internal cavity size was normal in size. There is no left ventricular hypertrophy. Left ventricular diastolic parameters are consistent with Grade I diastolic dysfunction (impaired relaxation). Right  Ventricle: The right ventricular size is normal. No increase in right ventricular wall thickness. Right ventricular systolic function is normal. Tricuspid regurgitation signal  is inadequate for assessing PA pressure. Left Atrium: Left atrial size was normal in size. Right Atrium: Right atrial size was normal in size. Pericardium: There is no evidence of pericardial effusion. Mitral Valve: The mitral valve is grossly normal. Mild mitral valve regurgitation. No evidence of mitral valve stenosis. MV peak gradient, 2.9 mmHg. The mean mitral valve gradient is 2.0 mmHg. Tricuspid Valve: The tricuspid valve is grossly normal. Tricuspid valve regurgitation is trivial. No evidence of tricuspid stenosis. Aortic Valve: The aortic valve is grossly normal. Aortic valve regurgitation is not visualized. No aortic stenosis is present. Aortic valve mean gradient measures 2.0 mmHg. Aortic valve peak gradient measures 3.2 mmHg. Aortic valve area, by VTI measures 1.67 cm. Pulmonic Valve: The pulmonic valve was grossly normal. Pulmonic valve regurgitation is not visualized. No evidence of pulmonic stenosis. Aorta: The aortic root and ascending aorta are structurally normal, with no evidence of dilitation. Venous: The inferior vena cava is normal in size with greater than 50% respiratory variability, suggesting right atrial pressure of 3 mmHg. IAS/Shunts: The atrial septum is grossly normal. Agitated saline contrast was given intravenously to evaluate for intracardiac shunting. Additional Comments: 3D was performed not requiring image post processing on an independent workstation and was abnormal.  LEFT VENTRICLE PLAX 2D LVIDd:         4.20 cm         Diastology LVIDs:         3.20 cm         LV e' medial:    2.68 cm/s LV PW:         0.90 cm         LV E/e' medial:  16.2 LV IVS:        0.90 cm         LV e' lateral:   3.97 cm/s LVOT diam:     1.80 cm         LV E/e' lateral: 11.0 LV SV:         24 LV SV Index:   15 LVOT Area:     2.54  cm        3D Volume EF                                LV 3D EF:    Left                                             ventricul LV Volumes (MOD)                            ar LV vol d, MOD    87.6 ml                    ejection A2C:                                        fraction LV vol d, MOD    133.0 ml                   by 3D A4C:  volume is LV vol s, MOD    48.8 ml                    47 %. A2C: LV vol s, MOD    70.7 ml A4C:                           3D Volume EF: LV SV MOD A2C:   38.8 ml       3D EF:        47 % LV SV MOD A4C:   133.0 ml LV SV MOD BP:    49.0 ml RIGHT VENTRICLE            IVC RV Basal diam:  3.80 cm    IVC diam: 1.20 cm RV Mid diam:    2.40 cm RV S prime:     9.41 cm/s TAPSE (M-mode): 1.6 cm LEFT ATRIUM             Index        RIGHT ATRIUM          Index LA diam:        2.20 cm 1.35 cm/m   RA Area:     8.84 cm LA Vol (A2C):   15.0 ml 9.22 ml/m   RA Volume:   18.80 ml 11.56 ml/m LA Vol (A4C):   20.0 ml 12.30 ml/m LA Biplane Vol: 18.7 ml 11.50 ml/m  AORTIC VALVE                    PULMONIC VALVE AV Area (Vmax):    1.81 cm     PV Vmax:       0.70 m/s AV Area (Vmean):   1.40 cm     PV Peak grad:  2.0 mmHg AV Area (VTI):     1.67 cm AV Vmax:           88.90 cm/s AV Vmean:          64.800 cm/s AV VTI:            0.143 m AV Peak Grad:      3.2 mmHg AV Mean Grad:      2.0 mmHg LVOT Vmax:         63.20 cm/s LVOT Vmean:        35.700 cm/s LVOT VTI:          0.094 m LVOT/AV VTI ratio: 0.66  AORTA Ao Root diam: 3.70 cm Ao Asc diam:  3.10 cm MITRAL VALVE MV Area (PHT): 1.55 cm      SHUNTS MV Area VTI:   1.66 cm      Systemic VTI:  0.09 m MV Peak grad:  2.9 mmHg      Systemic Diam: 1.80 cm MV Mean grad:  2.0 mmHg MV Vmax:       0.86 m/s MV Vmean:      58.4 cm/s MV Decel Time: 490 msec MR Peak grad:   147.4 mmHg MR Mean grad:   79.5 mmHg MR Vmax:        607.00 cm/s MR Vmean:       397.5 cm/s MR PISA:        0.25 cm MR PISA Radius: 0.20 cm MV E velocity: 43.50  cm/s MV A velocity: 70.70 cm/s MV E/A ratio:  0.62 Lennie Odor MD Electronically signed by Lennie Odor MD Signature Date/Time: 06/14/2023/12:56:52 PM    Final  CT Angio Chest PE W and/or Wo Contrast Result Date: 06/13/2023 CLINICAL DATA:  Shortness of breath and chest pain, initial encounter EXAM: CT ANGIOGRAPHY CHEST WITH CONTRAST TECHNIQUE: Multidetector CT imaging of the chest was performed using the standard protocol during bolus administration of intravenous contrast. Multiplanar CT image reconstructions and MIPs were obtained to evaluate the vascular anatomy. RADIATION DOSE REDUCTION: This exam was performed according to the departmental dose-optimization program which includes automated exposure control, adjustment of the mA and/or kV according to Christina size and/or use of iterative reconstruction technique. CONTRAST:  75mL OMNIPAQUE IOHEXOL 350 MG/ML SOLN COMPARISON:  Chest x-ray from earlier in the same day. FINDINGS: Cardiovascular: Thoracic aorta and its branches show mild atherosclerotic calcifications. No aneurysmal dilatation or dissection is seen. The heart is at the upper limits of normal in size. The pulmonary artery demonstrates a normal branching pattern bilaterally. No intraluminal filling defect to suggest pulmonary embolism is noted. Mediastinum/Nodes: Thoracic inlet is within normal limits. No hilar or mediastinal adenopathy is noted. The esophagus as visualized is within normal limits. Lungs/Pleura: Mild emphysematous changes are noted. Mild scarring is noted in the left lung base posteriorly. No focal infiltrate, effusion or parenchymal nodule is seen. Upper Abdomen: Visualized upper abdomen shows no acute abnormality. Musculoskeletal: No chest wall abnormality. No acute or significant osseous findings. Review of the MIP images confirms the above findings. IMPRESSION: No evidence of pulmonary emboli. No acute abnormality is seen. Aortic Atherosclerosis (ICD10-I70.0) and Emphysema  (ICD10-J43.9). Electronically Signed   By: Alcide Clever M.D.   On: 06/13/2023 00:16   DG Chest 2 View Result Date: 06/12/2023 CLINICAL DATA:  Left-sided chest pain and shortness of breath. EXAM: CHEST - 2 VIEW COMPARISON:  None Available. FINDINGS: The heart size and mediastinal contours are within normal limits. There is moderate severity calcification of the aortic arch and tortuosity of the descending thoracic aorta. Both lungs are clear. Radiopaque surgical clips are seen within the soft tissues of the left breast. The visualized skeletal structures are unremarkable. IMPRESSION: No active cardiopulmonary disease. Electronically Signed   By: Aram Candela M.D.   On: 06/12/2023 22:07    Microbiology: Results for orders placed or performed during the hospital encounter of 06/12/23  Resp panel by RT-PCR (RSV, Flu A&B, Covid) Anterior Nasal Swab     Status: None   Collection Time: 06/12/23  9:12 PM   Specimen: Anterior Nasal Swab  Result Value Ref Range Status   SARS Coronavirus 2 by RT PCR NEGATIVE NEGATIVE Final   Influenza A by PCR NEGATIVE NEGATIVE Final   Influenza B by PCR NEGATIVE NEGATIVE Final    Comment: (NOTE) The Xpert Xpress SARS-CoV-2/FLU/RSV plus assay is intended as an aid in the diagnosis of influenza from Nasopharyngeal swab specimens and should not be used as a sole basis for treatment. Nasal washings and aspirates are unacceptable for Xpert Xpress SARS-CoV-2/FLU/RSV testing.  Fact Sheet for Patients: BloggerCourse.com  Fact Sheet for Healthcare Providers: SeriousBroker.it  This test is not yet approved or cleared by the [place] FDA and has been authorized for detection and/or diagnosis of SARS-CoV-2 by FDA under an Emergency Use Authorization (EUA). This EUA will remain in effect (meaning this test can be used) for the duration of the COVID-19 declaration under Section 564(b)(1) of the Act, 21 U.S.C. section  360bbb-3(b)(1), unless the authorization is terminated or revoked.     Resp Syncytial Virus by PCR NEGATIVE NEGATIVE Final    Comment: (NOTE) Fact Sheet for Patients: BloggerCourse.com  Fact Sheet for Healthcare Providers: SeriousBroker.it  This test is not yet approved or cleared by the [place] FDA and has been authorized for detection and/or diagnosis of SARS-CoV-2 by FDA under an Emergency Use Authorization (EUA). This EUA will remain in effect (meaning this test can be used) for the duration of the COVID-19 declaration under Section 564(b)(1) of the Act, 21 U.S.C. section 360bbb-3(b)(1), unless the authorization is terminated or revoked.  Performed at [facility], [address], [place], [place] [address]   Group A Strep by PCR     Status: None   Collection Time: 06/12/23 10:25 PM   Specimen: Throat; Sterile Swab  Result Value Ref Range Status   Group A Strep by PCR NOT DETECTED NOT DETECTED Final    Comment: Performed at [facility], [address], [place], [place] [address]    Labs: CBC: Recent Labs  Lab 06/12/23 2112 06/13/23 0525 06/14/23 0346  WBC 5.9 5.8 4.6  HGB 13.0 12.6 13.0  HCT 37.1 35.3* 36.3  MCV 103.1* 102.3* 100.6*  PLT 237 209 241   Basic Metabolic Panel: Recent Labs  Lab 06/12/23 2112 06/13/23 0525 06/14/23 0346 06/15/23 0252  NA 131* 132* 130* 130*  K 3.4* 3.5 3.0* 3.7  CL 93* 95* 95* 95*  CO2 23 24 21* 23  GLUCOSE 114* 100* 76 86  BUN <5* 5* 6* 6*  CREATININE 0.90 0.78 0.72 0.67  CALCIUM 9.4 9.3 9.2 9.4  MG  --   --  1.7  --    Liver Function Tests: Recent Labs  Lab 06/12/23 2112  AST 34  ALT 28  ALKPHOS 59  BILITOT 1.0  PROT 6.5  ALBUMIN 3.6   CBG: No results for input(s): "GLUCAP" in the last 168 hours.  Discharge time spent: greater than 30 minutes.  Signed: Coralie Keens, MD [facility] 06/15/2023

## 2023-06-15 NOTE — Assessment & Plan Note (Addendum)
 Follow up as outpatient.   Anemia of chronic disease.  Low folic acid, she will continue supplementation as outpatient.  Follow up levels as outpatient.

## 2023-06-15 NOTE — Assessment & Plan Note (Signed)
 Continue statin therapy.

## 2023-06-15 NOTE — Assessment & Plan Note (Signed)
 Cardiac catheterization with minimal non obstructive coronary artery disease.  Evidence of coronary microvascular dysfunction with CFR of 1,5 with normal being greater than 2 to 2,5 and IMR of 75 with normal being less than 25.   Patient had no further angina type chest pain during her hospitalization.  Plan to continue medical therapy with aspirin and statin. Metoprolol and ACE inh for blood pressure control.  If recurrent angina symptoms to consider Ranolazine 500 mg bid.

## 2023-06-15 NOTE — Plan of Care (Signed)

## 2023-06-15 NOTE — Assessment & Plan Note (Signed)
 No signs of exacerbation, continue with bronchodilator therapy.

## 2023-06-15 NOTE — Assessment & Plan Note (Addendum)
 Smoking cessation counseling.   Alcohol abuse, she has no signs of acute withdrawal.

## 2023-06-15 NOTE — Assessment & Plan Note (Signed)
 Continue blood pressure control with amlodipine, metoprolol and benazepril.  Follow up as outpatient.

## 2023-06-15 NOTE — Progress Notes (Addendum)
   Patient Name: Christina Sanchez Date of Encounter: 06/15/2023 [facility] Cardiologist: Bryan Lemma, MD   Interval Summary  .    Does have some mild chest discomfort this morning with palpitation to the anterior chest.   Vital Signs .    Vitals:   06/14/23 1916 06/15/23 0008 06/15/23 0437 06/15/23 0903  BP: (!) 143/92 (!) 151/94 (!) 144/110 (!) 147/88  Pulse: 85 84 81   Resp: 16 16 19    Temp: 99.2 F (37.3 C) 99.4 F (37.4 C) 99.4 F (37.4 C)   TempSrc: Oral Oral Oral   SpO2: 99% 99% 98%   Weight:   58.5 kg   Height:        Intake/Output Summary (Last 24 hours) at 06/15/2023 1041 Last data filed at 06/14/2023 1700 Gross per 24 hour  Intake 120 ml  Output --  Net 120 ml      06/15/2023    4:37 AM 06/14/2023    4:53 AM 06/12/2023    8:59 PM  Last 3 Weights  Weight (lbs) 128 lb 15.5 oz 123 lb 0.3 oz 130 lb  Weight (kg) 58.5 kg 55.8 kg 58.968 kg      Telemetry/ECG    Sinus Rhythm - Personally Reviewed  Physical Exam .    GEN: No acute distress.   Neck: No JVD Cardiac: RRR, no murmurs, rubs, or gallops.  Respiratory: Clear to auscultation bilaterally. GI: Soft, nontender, non-distended  MS: No edema Skin: right radial cath site stable   Assessment & Plan .     73 y.o. female with a hx of symptomatic PVCs, hypertension, hyperlipidemia, current smoker,emphysema, history of breast cancer, with a history of an abnormal myocardial perfusion study (09/2022), who was seen 06/13/2023 for the evaluation of chest pain at the request of Dr. Rito Ehrlich.    Chest pain Dyspnea on exertion -- aortic atherosclerosis on CT, hsTn negative x2. EKG without acute ST/T wave changes -- prior abnormal stress testing and recurrent chest pain, therefore underwent cardiac cath 3/4 showing minimal nonobstructive CAD with evidence for microvascular disease with CRF of 1.5 (normal 2-2.5) -- continue ASA, statin, amlodipine 10mg  daily, Toprol (increase to 50mg  daily)   HTN -- BP  remains elevated this morning but improved -- continue amlodipine to 10mg  daily, continue benazepril 10mg  daily, increase Toprol to 50mg  daily   Hx of symptomatic PVCs -- occ PVCs noted on telemetry -- K+ 3.7, Mag 1.7, supp -- continue BB  Acute HFmrEF -- echo showed LVEF of 45-50%, global hypokinesis, g1DD, normal RV -- GDMT: Toprol 50mg  daily, benazepril 10mg  daily, add jardiance    HLD -- aortic atherosclerosis on CT, HDL 74 -- continue Crestor 20mg  daily   Will arrange for outpatient follow up in the office  For questions or updates, please contact [facility] Please consult [email or web address] for contact info under   Signed, Laverda Page, NP    Patient seen and examined. Agree with assessment and plan.  No recurrent anginal type symptomatology.  Patient does have some mild soreness over the lower left costochondral region which is musculoskeletal.  Catheterization data reviewed including assessment for microvascular angina.  If recurrent anginal symptoms develop, consider initiation of ranolazine 500 mg twice a day may be beneficial.   Lennette Bihari, MD, [facility] 06/15/2023 11:46 AM

## 2023-06-15 NOTE — Assessment & Plan Note (Signed)
 Echocardiogram with mild reduction in LV systolic function with EF 45 to 50%. Global hypokinesis, RV systolic function is preserved, no significant valvular disease.   Exacerbation has been ruled out.  Patient will continue metoprolol and benazepril.  Added SGLT 2 inh.  Follow up as outpatient.

## 2023-06-15 NOTE — TOC Transition Note (Signed)
 Transition of Care [facility]) - Discharge Note   Patient Details  Name: Christina Sanchez MRN: [id number] Date of Birth: 1950/07/04  Transition of [facility]) CM/SW Contact:  Leone Haven, RN Phone Number: 06/15/2023, 2:43 PM   Clinical Narrative:    For dc today, son to transport home.         Patient Goals and CMS Choice            Discharge Placement                       Discharge Plan and Services Additional resources added to the After Visit Summary for                                       Social Drivers of Health (SDOH) Interventions SDOH Screenings   Food Insecurity: No Food Insecurity (06/13/2023)  Housing: Low Risk  (06/13/2023)  Transportation Needs: Unmet Transportation Needs (06/13/2023)  Utilities: Not At Risk (06/13/2023)  Social Connections: Socially Isolated (06/13/2023)  Tobacco Use: High Risk (06/13/2023)     Readmission Risk Interventions     No data to display

## 2023-06-15 NOTE — Assessment & Plan Note (Signed)
 Hyponatremia.   At the time of her discharge her K is 3,7 with serum bicarbonate at 23 and cr 0,67 BUN 6, Na 130  and Mg 1,7   Patient will continue close follow up renal function and electrolytes as outpatient.

## 2023-06-16 LAB — LIPOPROTEIN A (LPA): Lipoprotein (a): 67 nmol/L — ABNORMAL HIGH (ref <75.0)

## 2023-06-17 ENCOUNTER — Other Ambulatory Visit: Payer: Medicare (Managed Care)

## 2023-06-21 NOTE — Progress Notes (Deleted)
 Cardiology Office Note    Date:  06/21/2023  ID:  KEIRAN SIAS, DOB [birth year], MRN [id number] PCP:  Sharmon Revere, MD  Cardiologist:  Bryan Lemma, MD  Electrophysiologist:  None   Chief Complaint: ***  History of Present Illness: .    Christina Sanchez is a 73 y.o. female with visit-pertinent history of symptomatic PVCs, hypertension, hyperlipidemia, current smoker, emphysema, history of breast cancer with a history of abnormal myocardial perfusion study in 09/2022.  Patient establish care with Dr. Herbie Baltimore in 09/2022 she had undergone a stress test in June 2024 that showed a fixed apical inferior perfusion defect with abnormal wall motion consistent with possible prior infarction, EF was 41%, no ischemia.  Echocardiogram done shortly after in 11/2022 showed EF 55 to 60%, no LV RWMA, grade 1 diastolic dysfunction, normal RV function, mild dilation of aorta.  Patient was last seen by Dr. Herbie Baltimore via video visit in 10/2022, he he discussed favoring cardiac catheterization prior to echo as noted above.  Unfortunately patient never followed up with clinic.  On 06/12/2023 she presented to [facility], ED via EMS for left-sided chest pain and shortness of breath that started the night prior.  Patient reported shortness of breath on exertion for upwards of 2 weeks and developed retrosternal chest pressure over the prior 3 days.  Reported that chest pain also occurred when she was experienced palpitations.  EKG in the ED showed sinus rhythm without signs of acute ischemia, troponin was negative x 2, CTA chest negative for PE but did show aortic atherosclerosis and emphysema.  Echocardiogram on 3//25 indicated LVEF of 45 to 50%, global hypokinesis, G1 DD, RV systolic function was normal, there were no significant valvular abnormalities.  Left heart catheterization on 06/14/2023 indicated minimal nonobstructive coronary artery disease, evidence for coronary microvascular dysfunction with a CFR of  1.5.  Today she presents for follow-up.  She reports that she   CAD: Patient with aortic atherosclerosis on CT, prior abnormal stress testing and recurrent chest pain she underwent cardiac catheterization on 3 4 that showed minimal nonobstructive CAD with evidence for microvascular disease with CRF of 1.5. Patient was continued on aspirin, statin, amlodipine 10 mg daily and Toprol which was increased to 50 mg daily. Hx of PVCs:  HFmrEF: Echocardiogram showed LVEF of 45 to 50%, global hypokinesis, G1 DD and normal RV. Continue GDMT of Toprol 50 mg daily, benazepril 10 mg daily and Jardiance 10 mg daily. HTN: Blood pressure today Hyperlipidemia:  Labwork independently reviewed:   ROS: .   *** denies chest pain, shortness of breath, lower extremity edema, fatigue, palpitations, melena, hematuria, hemoptysis, diaphoresis, weakness, presyncope, syncope, orthopnea, and PND.  All other systems are reviewed and otherwise negative.  Studies Reviewed: Marland Kitchen    EKG:  EKG is ordered today, personally reviewed, demonstrating ***     CV Studies: Cardiac studies reviewed are outlined and summarized above. Otherwise please see EMR for full report. Cardiac Studies & Procedures   ______________________________________________________________________________________________ CARDIAC CATHETERIZATION  CARDIAC CATHETERIZATION 06/14/2023  Narrative 1.  Minimal nonobstructive coronary artery disease. 2.  LVEDP of 6 mmHg. 3.  Evidence for coronary microvascular dysfunction with a CFR of 1.5 with normal being greater than 2-2.5 and IMR of 75 with normal being less than 25.  Recommendation: Medical therapy.  Findings Coronary Findings Diagnostic  Dominance: Right  Left Anterior Descending The vessel exhibits minimal luminal irregularities.  Left Circumflex The vessel exhibits minimal luminal irregularities.  Right Coronary Artery The vessel exhibits minimal  luminal  irregularities.  Intervention  No interventions have been documented.   STRESS TESTS  MYOCARDIAL PERFUSION IMAGING 09/29/2022  Narrative   Findings are consistent with infarction. The study is intermediate risk.   LV perfusion is abnormal. There is no evidence of ischemia. There is evidence of infarction. Defect 1: There is a small defect with mild reduction in uptake present in the apical inferior location(s) that is fixed. There is abnormal wall motion in the defect area. Consistent with infarction.   Left ventricular function is abnormal. Global function is mildly reduced. Nuclear stress EF: 41 %. The left ventricular ejection fraction is moderately decreased (30-44%). End diastolic cavity size is normal. End systolic cavity size is normal.  Fixed apical inferior perfusion defect  with abnormal wall motion consistent with artifact No PVCs during Lexiscan stress LVEF 41% Intermediate risk study due to systolic dysfunction.  No ischemia.   ECHOCARDIOGRAM  ECHOCARDIOGRAM COMPLETE 06/14/2023  Narrative ECHOCARDIOGRAM REPORT    Patient Name:   Christina Sanchez Date of Exam: 06/14/2023 Medical Rec #:  [id number]          Height:       66.0 in Accession #:    [id number]         Weight:       123.0 lb Date of Birth:  May 17, 1950          BSA:          1.627 m Patient Age:    72 years           BP:           149/93 mmHg Patient Gender: F                  HR:           76 bpm. Exam Location:  Inpatient  Procedure: 2D Echo, 3D Echo, Cardiac Doppler, Color Doppler and Saline Contrast Bubble Study (Both Spectral and Color Flow Doppler were utilized during procedure).  Indications:    Chest pain  History:        Patient has prior history of Echocardiogram examinations, most recent 12/08/2022. Arrythmias:PVC, Signs/Symptoms:Dyspnea; Risk Factors:Current Smoker, Dyslipidemia and Hypertension.  Sonographer:    Vern Claude Referring Phys: [id number] TAYLOR A PARCELLS  IMPRESSIONS   1.  Left ventricular ejection fraction, by estimation, is 45 to 50%. Left ventricular ejection fraction by 3D volume is 47 %. The left ventricle has mildly decreased function. The left ventricle demonstrates global hypokinesis. Left ventricular diastolic parameters are consistent with Grade I diastolic dysfunction (impaired relaxation). 2. Right ventricular systolic function is normal. The right ventricular size is normal. Tricuspid regurgitation signal is inadequate for assessing PA pressure. 3. The mitral valve is grossly normal. Mild mitral valve regurgitation. No evidence of mitral stenosis. 4. The aortic valve is grossly normal. Aortic valve regurgitation is not visualized. No aortic stenosis is present. 5. The inferior vena cava is normal in size with greater than 50% respiratory variability, suggesting right atrial pressure of 3 mmHg.  Comparison(s): Changes from prior study are noted. The left ventricular function is worsened.  FINDINGS Left Ventricle: Left ventricular ejection fraction, by estimation, is 45 to 50%. Left ventricular ejection fraction by 3D volume is 47 %. The left ventricle has mildly decreased function. The left ventricle demonstrates global hypokinesis. Global longitudinal strain performed but not reported based on interpreter judgement due to suboptimal tracking. The left ventricular internal cavity size was normal in size. There is no left ventricular  hypertrophy. Left ventricular diastolic parameters are consistent with Grade I diastolic dysfunction (impaired relaxation).  Right Ventricle: The right ventricular size is normal. No increase in right ventricular wall thickness. Right ventricular systolic function is normal. Tricuspid regurgitation signal is inadequate for assessing PA pressure.  Left Atrium: Left atrial size was normal in size.  Right Atrium: Right atrial size was normal in size.  Pericardium: There is no evidence of pericardial effusion.  Mitral Valve:  The mitral valve is grossly normal. Mild mitral valve regurgitation. No evidence of mitral valve stenosis. MV peak gradient, 2.9 mmHg. The mean mitral valve gradient is 2.0 mmHg.  Tricuspid Valve: The tricuspid valve is grossly normal. Tricuspid valve regurgitation is trivial. No evidence of tricuspid stenosis.  Aortic Valve: The aortic valve is grossly normal. Aortic valve regurgitation is not visualized. No aortic stenosis is present. Aortic valve mean gradient measures 2.0 mmHg. Aortic valve peak gradient measures 3.2 mmHg. Aortic valve area, by VTI measures 1.67 cm.  Pulmonic Valve: The pulmonic valve was grossly normal. Pulmonic valve regurgitation is not visualized. No evidence of pulmonic stenosis.  Aorta: The aortic root and ascending aorta are structurally normal, with no evidence of dilitation.  Venous: The inferior vena cava is normal in size with greater than 50% respiratory variability, suggesting right atrial pressure of 3 mmHg.  IAS/Shunts: The atrial septum is grossly normal. Agitated saline contrast was given intravenously to evaluate for intracardiac shunting.  Additional Comments: 3D was performed not requiring image post processing on an independent workstation and was abnormal.   LEFT VENTRICLE PLAX 2D LVIDd:         4.20 cm         Diastology LVIDs:         3.20 cm         LV e' medial:    2.68 cm/s LV PW:         0.90 cm         LV E/e' medial:  16.2 LV IVS:        0.90 cm         LV e' lateral:   3.97 cm/s LVOT diam:     1.80 cm         LV E/e' lateral: 11.0 LV SV:         24 LV SV Index:   15 LVOT Area:     2.54 cm        3D Volume EF LV 3D EF:    Left ventricul LV Volumes (MOD)                            ar LV vol d, MOD    87.6 ml                    ejection A2C:                                        fraction LV vol d, MOD    133.0 ml                   by 3D A4C:  volume is LV vol s, MOD    48.8 ml                     47 %. A2C: LV vol s, MOD    70.7 ml A4C:                           3D Volume EF: LV SV MOD A2C:   38.8 ml       3D EF:        47 % LV SV MOD A4C:   133.0 ml LV SV MOD BP:    49.0 ml  RIGHT VENTRICLE            IVC RV Basal diam:  3.80 cm    IVC diam: 1.20 cm RV Mid diam:    2.40 cm RV S prime:     9.41 cm/s TAPSE (M-mode): 1.6 cm  LEFT ATRIUM             Index        RIGHT ATRIUM          Index LA diam:        2.20 cm 1.35 cm/m   RA Area:     8.84 cm LA Vol (A2C):   15.0 ml 9.22 ml/m   RA Volume:   18.80 ml 11.56 ml/m LA Vol (A4C):   20.0 ml 12.30 ml/m LA Biplane Vol: 18.7 ml 11.50 ml/m AORTIC VALVE                    PULMONIC VALVE AV Area (Vmax):    1.81 cm     PV Vmax:       0.70 m/s AV Area (Vmean):   1.40 cm     PV Peak grad:  2.0 mmHg AV Area (VTI):     1.67 cm AV Vmax:           88.90 cm/s AV Vmean:          64.800 cm/s AV VTI:            0.143 m AV Peak Grad:      3.2 mmHg AV Mean Grad:      2.0 mmHg LVOT Vmax:         63.20 cm/s LVOT Vmean:        35.700 cm/s LVOT VTI:          0.094 m LVOT/AV VTI ratio: 0.66  AORTA Ao Root diam: 3.70 cm Ao Asc diam:  3.10 cm  MITRAL VALVE MV Area (PHT): 1.55 cm      SHUNTS MV Area VTI:   1.66 cm      Systemic VTI:  0.09 m MV Peak grad:  2.9 mmHg      Systemic Diam: 1.80 cm MV Mean grad:  2.0 mmHg MV Vmax:       0.86 m/s MV Vmean:      58.4 cm/s MV Decel Time: 490 msec MR Peak grad:   147.4 mmHg MR Mean grad:   79.5 mmHg MR Vmax:        607.00 cm/s MR Vmean:       397.5 cm/s MR PISA:        0.25 cm MR PISA Radius: 0.20 cm MV E velocity: 43.50 cm/s MV A velocity: 70.70 cm/s MV E/A ratio:  0.62  Lennie Odor MD Electronically signed by Lennie Odor MD Signature Date/Time: 06/14/2023/12:56:52 PM  Final          ______________________________________________________________________________________________       Current Reported Medications:.    No outpatient medications have been marked as  taking for the 06/23/23 encounter (Appointment) with Rip Harbour, NP.    Physical Exam:    VS:  There were no vitals taken for this visit.   Wt Readings from Last 3 Encounters:  06/15/23 128 lb 15.5 oz (58.5 kg)  10/20/22 138 lb (62.6 kg)  09/13/22 138 lb 12.8 oz (63 kg)    GEN: Well nourished, well developed in no acute distress NECK: No JVD; No carotid bruits CARDIAC: ***RRR, no murmurs, rubs, gallops RESPIRATORY:  Clear to auscultation without rales, wheezing or rhonchi  ABDOMEN: Soft, non-tender, non-distended EXTREMITIES:  No edema; No acute deformity     Asessement and Plan:.     ***     Disposition: F/u with ***  Signed, Rip Harbour, NP

## 2023-06-23 ENCOUNTER — Ambulatory Visit: Attending: Cardiology | Admitting: Cardiology

## 2023-06-23 DIAGNOSIS — I5022 Chronic systolic (congestive) heart failure: Secondary | ICD-10-CM

## 2023-06-23 DIAGNOSIS — E782 Mixed hyperlipidemia: Secondary | ICD-10-CM

## 2023-06-23 DIAGNOSIS — I2089 Other forms of angina pectoris: Secondary | ICD-10-CM

## 2023-06-23 DIAGNOSIS — I10 Essential (primary) hypertension: Secondary | ICD-10-CM

## 2023-06-28 ENCOUNTER — Other Ambulatory Visit: Payer: Self-pay | Admitting: Family Medicine

## 2023-06-28 ENCOUNTER — Ambulatory Visit
Admission: RE | Admit: 2023-06-28 | Discharge: 2023-06-28 | Disposition: A | Discharge status: Home / Self Care | Payer: Medicare (Managed Care) | Source: Ambulatory Visit | Attending: Family Medicine | Admitting: Family Medicine

## 2023-06-28 DIAGNOSIS — R319 Hematuria, unspecified: Secondary | ICD-10-CM

## 2023-07-12 ENCOUNTER — Other Ambulatory Visit: Payer: Self-pay | Admitting: Family Medicine

## 2023-07-12 ENCOUNTER — Encounter: Payer: Self-pay | Admitting: Family Medicine

## 2023-07-12 DIAGNOSIS — R600 Localized edema: Secondary | ICD-10-CM

## 2023-07-13 ENCOUNTER — Ambulatory Visit
Admission: RE | Admit: 2023-07-13 | Discharge: 2023-07-13 | Discharge status: Home / Self Care | Source: Ambulatory Visit | Attending: Family Medicine

## 2023-07-13 DIAGNOSIS — R600 Localized edema: Secondary | ICD-10-CM

## 2023-07-14 ENCOUNTER — Ambulatory Visit
Admission: RE | Admit: 2023-07-14 | Discharge: 2023-07-14 | Disposition: A | Discharge status: Home / Self Care | Payer: Medicare Other | Source: Ambulatory Visit | Attending: Family Medicine | Admitting: Family Medicine

## 2023-07-14 DIAGNOSIS — Z9889 Other specified postprocedural states: Secondary | ICD-10-CM

## 2023-07-18 ENCOUNTER — Other Ambulatory Visit: Payer: Self-pay | Admitting: Family Medicine

## 2023-07-18 ENCOUNTER — Ambulatory Visit
Admission: RE | Admit: 2023-07-18 | Discharge: 2023-07-18 | Disposition: A | Discharge status: Home / Self Care | Source: Ambulatory Visit | Attending: Family Medicine

## 2023-07-18 DIAGNOSIS — M4306 Spondylolysis, lumbar region: Secondary | ICD-10-CM

## 2023-10-11 ENCOUNTER — Ambulatory Visit: Admitting: Podiatry

## 2023-10-11 ENCOUNTER — Encounter: Payer: Self-pay | Admitting: Podiatry

## 2023-10-11 DIAGNOSIS — L600 Ingrowing nail: Secondary | ICD-10-CM

## 2023-10-11 NOTE — Progress Notes (Signed)
 Subjective:  Patient ID: Christina Sanchez, female    DOB: May 26, 1950,   MRN: [id number]  Chief Complaint  Patient presents with   Ingrown Toenail    Patient states she has a ingrown toe nail on right foot 2nd toe, Patient states that sometimes she has numbness on dorsal of left foot     73 y.o. female presents for concern of ingrown nail on right great toe. Relates this has been painful for a while and is hoping to have the whole nail removed. She has tried trimming but not improving. She does relate burning tingling in her legs and a feeling of walking on sand. Currently taking gabapentin . She is a smoker.  . Denies any other pedal complaints. Denies n/v/f/c.   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   Current every day smoker    Started smoking at age 50 (24.5 pack smoking history.  On going 1/2 PPD   Hyperlipidemia    Hypertension    MDD (major depressive disorder)    Without psychotic features.  Pending behavioral health evaluation.   Personal history of radiation therapy     Objective:  Physical Exam: Vascular: DP/PT pulses 2/4 bilateral. CFT <3 seconds. Normal hair growth on digits. No edema.  Skin. No lacerations or abrasions bilateral feet. Incurvation of medial border of right second digit nail does extend to central aspect of nail. Tender to palpation  Musculoskeletal: MMT 5/5 bilateral lower extremities in DF, PF, Inversion and Eversion. Deceased ROM in DF of ankle joint.  Neurological: Sensation intact to light touch. Protective sensation diminished.   Assessment:   1. Ingrown nail of second toe of right foot      Plan:  Patient was evaluated and treated and all questions  answered. Discussed ingrown toenails etiology and treatment options including procedure for removal vs conservative care.  Patient requesting removal of ingrown nail today. Procedure below.  Discussed procedure and post procedure care and patient expressed understanding.  Discussed smoking cessation and risk of healing.  Will follow-up in 2 weeks for nail check or sooner if any problems arise.    Procedure:  Procedure: total Nail Avulsion of right second digit nail  Surgeon: Asberry Failing, DPM  Pre-op Dx: Ingrown toenail without infection Post-op: Same  Place of Surgery: Office exam room.  Indications for surgery: Painful and ingrown toenail.    The patient is requesting removal of nail with  chemical matrixectomy. Risks and complications were discussed with the patient for which they understand and written consent was obtained. Under sterile conditions a total of 3 mL of  1% lidocaine  plain was infiltrated in a hallux block fashion. Once anesthetized, the skin was prepped in sterile fashion. A tourniquet was then applied. Next the entire second digit nail was removed.   Next phenol was then applied under standard conditions to permanently destroy the matrix and copiously irrigated. Silvadene was applied. A dry sterile dressing was applied. After application of the dressing the tourniquet was removed and there is found to be an immediate capillary refill time to the digit. The patient tolerated the procedure well without any complications. Post procedure instructions were discussed the patient for which he verbally  understood. Follow-up in two weeks for nail check or sooner if any problems are to arise. Discussed signs/symptoms of infection and directed to call the office immediately should any occur or go directly to the emergency room. In the meantime, encouraged to call the office with any questions, concerns, changes symptoms.   Asberry Failing, DPM

## 2023-10-11 NOTE — Patient Instructions (Addendum)

## 2023-10-25 ENCOUNTER — Ambulatory Visit: Admitting: Podiatry

## 2023-10-25 ENCOUNTER — Encounter: Payer: Self-pay | Admitting: Podiatry

## 2023-10-25 DIAGNOSIS — L600 Ingrowing nail: Secondary | ICD-10-CM

## 2023-10-25 NOTE — Progress Notes (Signed)
  Subjective:  Patient ID: Christina Sanchez, female    DOB: 1950-07-11,   MRN: [id number]  Chief Complaint  Patient presents with   Nail Problem    It's hurting right now and it's still bleeding.    73 y.o. female presents for follow-up of avulsion of second digit nail. Relates still bleeding and getting pain in the toe.  She does relate burning tingling in her legs and a feeling of walking on sand. Currently taking gabapentin . She is a smoker.  . Denies any other pedal complaints. Denies n/v/f/c.   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   Current every day smoker    Started smoking at age 20 (24.5 pack smoking history.  On going 1/2 PPD   Hyperlipidemia    Hypertension    MDD (major depressive disorder)    Without psychotic features.  Pending behavioral health evaluation.   Personal history of radiation therapy     Objective:  Physical Exam: Vascular: DP/PT pulses 2/4 bilateral. CFT <3 seconds. Normal hair growth on digits. No edema.  Skin. No lacerations or abrasions bilateral feet. Right second digit nail bed with granular base no signs of infection. Healing Musculoskeletal: MMT 5/5 bilateral lower extremities in DF, PF, Inversion and Eversion. Deceased ROM in DF of ankle joint.  Neurological: Sensation intact to light touch. Protective sensation diminished.   Assessment:   1. Ingrown nail of second toe of right foot       Plan:  Patient was evaluated and treated and all questions answered. Toe was evaluated and appears to be healing well.  May continue epsom salt soaks every other day. And advised on betadine instead of neosporin to keep toe less moist.  Advised on smoking  cessation.  Patient to follow-up in 2 more weeks for recheck.    Asberry Failing, DPM

## 2023-11-01 ENCOUNTER — Other Ambulatory Visit: Payer: Self-pay | Admitting: Cardiology

## 2023-11-04 ENCOUNTER — Other Ambulatory Visit: Payer: Self-pay

## 2023-11-04 ENCOUNTER — Encounter (HOSPITAL_BASED_OUTPATIENT_CLINIC_OR_DEPARTMENT_OTHER): Payer: Self-pay | Admitting: Emergency Medicine

## 2023-11-04 ENCOUNTER — Emergency Department (HOSPITAL_BASED_OUTPATIENT_CLINIC_OR_DEPARTMENT_OTHER)
Admission: EM | Admit: 2023-11-04 | Discharge: 2023-11-04 | Disposition: A | Discharge status: Home / Self Care | Attending: Emergency Medicine | Admitting: Emergency Medicine

## 2023-11-04 ENCOUNTER — Emergency Department (HOSPITAL_BASED_OUTPATIENT_CLINIC_OR_DEPARTMENT_OTHER)

## 2023-11-04 DIAGNOSIS — K625 Hemorrhage of anus and rectum: Secondary | ICD-10-CM | POA: Diagnosis present

## 2023-11-04 DIAGNOSIS — K5732 Diverticulitis of large intestine without perforation or abscess without bleeding: Secondary | ICD-10-CM | POA: Diagnosis not present

## 2023-11-04 DIAGNOSIS — F17210 Nicotine dependence, cigarettes, uncomplicated: Secondary | ICD-10-CM | POA: Insufficient documentation

## 2023-11-04 DIAGNOSIS — Z853 Personal history of malignant neoplasm of breast: Secondary | ICD-10-CM | POA: Insufficient documentation

## 2023-11-04 DIAGNOSIS — I10 Essential (primary) hypertension: Secondary | ICD-10-CM | POA: Insufficient documentation

## 2023-11-04 DIAGNOSIS — K5792 Diverticulitis of intestine, part unspecified, without perforation or abscess without bleeding: Secondary | ICD-10-CM

## 2023-11-04 LAB — COMPREHENSIVE METABOLIC PANEL WITH GFR
ALT: 13 U/L (ref 0–44)
AST: 18 U/L (ref 15–41)
Albumin: 4.7 g/dL (ref 3.5–5.0)
Alkaline Phosphatase: 106 U/L (ref 38–126)
Anion gap: 13 (ref 5–15)
BUN: 17 mg/dL (ref 8–23)
CO2: 20 mmol/L — ABNORMAL LOW (ref 22–32)
Calcium: 9.8 mg/dL (ref 8.9–10.3)
Chloride: 102 mmol/L (ref 98–111)
Creatinine, Ser: 0.81 mg/dL (ref 0.44–1.00)
GFR, Estimated: >60 mL/min (ref >60)
Glucose, Bld: 87 mg/dL (ref 70–99)
Potassium: 4.4 mmol/L (ref 3.5–5.1)
Sodium: 135 mmol/L (ref 135–145)
Total Bilirubin: 0.4 mg/dL (ref 0.0–1.2)
Total Protein: 7.6 g/dL (ref 6.5–8.1)

## 2023-11-04 LAB — URINALYSIS, W/ REFLEX TO CULTURE (INFECTION SUSPECTED)
Bacteria, UA: NONE SEEN
Bilirubin Urine: NEGATIVE
Glucose, UA: NEGATIVE mg/dL
Hgb urine dipstick: NEGATIVE
Ketones, ur: NEGATIVE mg/dL
Leukocytes,Ua: NEGATIVE
Nitrite: NEGATIVE
Protein, ur: NEGATIVE mg/dL
Specific Gravity, Urine: 1.02 (ref 1.005–1.030)
pH: 5.5 (ref 5.0–8.0)

## 2023-11-04 LAB — CBC WITH DIFFERENTIAL/PLATELET
Abs Immature Granulocytes: 0.01 K/uL (ref 0.00–0.07)
Basophils Absolute: 0.1 K/uL (ref 0.0–0.1)
Basophils Relative: 1 %
Eosinophils Absolute: 0.2 K/uL (ref 0.0–0.5)
Eosinophils Relative: 3 %
HCT: 30.9 % — ABNORMAL LOW (ref 36.0–46.0)
Hemoglobin: 10.5 g/dL — ABNORMAL LOW (ref 12.0–15.0)
Immature Granulocytes: 0 %
Lymphocytes Relative: 31 %
Lymphs Abs: 1.9 K/uL (ref 0.7–4.0)
MCH: 33.1 pg (ref 26.0–34.0)
MCHC: 34 g/dL (ref 30.0–36.0)
MCV: 97.5 fL (ref 80.0–100.0)
Monocytes Absolute: 0.3 K/uL (ref 0.1–1.0)
Monocytes Relative: 4 %
Neutro Abs: 3.7 K/uL (ref 1.7–7.7)
Neutrophils Relative %: 61 %
Platelets: 255 K/uL (ref 150–400)
RBC: 3.17 MIL/uL — ABNORMAL LOW (ref 3.87–5.11)
RDW: 13.2 % (ref 11.5–15.5)
WBC: 6.1 K/uL (ref 4.0–10.5)
nRBC: 0 % (ref 0.0–0.2)

## 2023-11-04 LAB — PROTIME-INR
INR: 1 (ref 0.8–1.2)
Prothrombin Time: 13.6 s (ref 11.4–15.2)

## 2023-11-04 LAB — LIPASE, BLOOD: Lipase: 18 U/L (ref 11–51)

## 2023-11-04 LAB — OCCULT BLOOD X 1 CARD TO LAB, STOOL: Fecal Occult Bld: POSITIVE — AB

## 2023-11-04 MED ORDER — IOHEXOL 300 MG/ML  SOLN
100.0000 mL | Freq: Once | INTRAMUSCULAR | Status: AC | PRN
  Administered 2023-11-04: 80 mL via INTRAVENOUS

## 2023-11-04 MED ORDER — PANTOPRAZOLE SODIUM 40 MG IV SOLR
40.0000 mg | Freq: Once | INTRAVENOUS | Status: AC
  Administered 2023-11-04: 40 mg via INTRAVENOUS
  Filled 2023-11-04: qty 10

## 2023-11-04 MED ORDER — AMOXICILLIN-POT CLAVULANATE 875-125 MG PO TABS
1.0000 | ORAL_TABLET | Freq: Once | ORAL | Status: AC
  Administered 2023-11-04: 1 via ORAL
  Filled 2023-11-04: qty 1

## 2023-11-04 MED ORDER — AMOXICILLIN-POT CLAVULANATE 875-125 MG PO TABS: 1.0000 | ORAL_TABLET | Freq: Two times a day (BID) | ORAL | 0 refills | Status: AC

## 2023-11-04 MED ORDER — SODIUM CHLORIDE 0.9 % IV BOLUS
500.0000 mL | Freq: Once | INTRAVENOUS | Status: AC
  Administered 2023-11-04: 500 mL via INTRAVENOUS

## 2023-11-04 NOTE — ED Notes (Signed)
 Patient transported to CT

## 2023-11-04 NOTE — Discharge Instructions (Signed)
 You were seen in the from today with rectal bleeding and some abdominal discomfort.  Your CT scan shows acute diverticulitis.  You may continue to have mild bleeding over the next several days as her antibiotics begin to work.  If you develop sudden worsening pain, fever, heavy bleeding beyond what you are experiencing now you should return to the emergency department immediately for reevaluation.  Please call your primary care doctor to make them aware and I am referring you to a gastroenterologist as often a colonoscopy is needed for close follow-up after this.

## 2023-11-04 NOTE — ED Triage Notes (Signed)
 Blood with blood clots in toilet Noticed last night Today some dizziness

## 2023-11-04 NOTE — ED Provider Notes (Signed)
 Emergency Department Provider Note   I have reviewed the triage vital signs and the nursing notes.   HISTORY  Chief Complaint Rectal Bleeding   HPI Christina Sanchez is a 73 y.o. female with past history reviewed below presents to the emergency department for evaluation of passage of blood clots and bright red blood per rectum.  Symptoms began at 5 PM yesterday.  She states she felt like she needed to have a bowel movement but began passing clots and blood.  She states since that time anytime she goes to have a bowel movement she is only passing blood.  She is beginning to feel lightheaded.  No fevers.  She has some occasional, mild abdominal discomfort.  Last colonoscopy was approximately 23 years ago.  She had some brief bleeding episodes in the past but nothing this persistent or heavy.  She is not anticoagulated.   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   Current every day smoker    Started smoking at age 44 (24.5 pack smoking history.  On going 1/2 PPD   Hyperlipidemia    Hypertension    MDD (major depressive disorder)    Without psychotic features.  Pending behavioral health evaluation.   Personal history of radiation therapy     Review of Systems  Constitutional: No fever/chills Cardiovascular: Denies chest pain. Respiratory: Denies shortness of breath. Gastrointestinal: Intermittent abdominal pain.  No nausea, no vomiting.  Positive clot passage and blood per rectum.  Skin: Negative for rash. Neurological: Negative for headaches.  ____________________________________________   PHYSICAL EXAM:  VITAL SIGNS: ED Triage Vitals  Encounter Vitals Group     BP 11/04/23  1445 (!) 113/102     Pulse Rate 11/04/23 1445 76     Resp 11/04/23 1445 18     Temp 11/04/23 1445 98.5 F (36.9 C)     Temp Source 11/04/23 1445 Oral     SpO2 11/04/23 1445 96 %   Constitutional: Alert and oriented. Well appearing and in no acute distress. Eyes: Conjunctivae are normal.  Head: Atraumatic. Nose: No congestion/rhinnorhea. Mouth/Throat: Mucous membranes are moist.  Neck: No stridor.   Cardiovascular: Normal rate, regular rhythm. Good peripheral circulation. Grossly normal heart sounds.   Respiratory: Normal respiratory effort.  No retractions. Lungs CTAB. Gastrointestinal: Soft and nontender. No distention.  Rectal exam performed with nurse tech chaperone at bedside.  Dark maroon stool in the vault.  No melena.  Grossly positive. Musculoskeletal: No gross deformities of extremities. Neurologic:  Normal speech and language.  Skin:  Skin is warm, dry and intact. No rash noted.   ____________________________________________   LABS (all labs ordered are listed, but only abnormal results are displayed)  Labs Reviewed  COMPREHENSIVE METABOLIC PANEL WITH GFR - Abnormal; Notable for the following components:      Result Value   CO2 20 (*)    All other components within normal limits  CBC WITH DIFFERENTIAL/PLATELET - Abnormal; Notable for the following components:   RBC 3.17 (*)    Hemoglobin 10.5 (*)    HCT 30.9 (*)    All other components within normal limits  OCCULT BLOOD X 1 CARD TO LAB, STOOL - Abnormal; Notable  for the following components:   Fecal Occult Bld POSITIVE (*)    All other components within normal limits  LIPASE, BLOOD  PROTIME-INR  URINALYSIS, W/ REFLEX TO CULTURE (INFECTION SUSPECTED)   ____________________________________________  EKG   EKG Interpretation Date/Time:  Friday November 04 2023 15:31:01 EDT Ventricular Rate:  66 PR Interval:  172 QRS Duration:  78 QT Interval:  376 QTC Calculation: 394 R Axis:   28  Text  Interpretation: Normal sinus rhythm Cannot rule out Anterior infarct (cited on or before 14-Jun-2023) Abnormal ECG When compared with ECG of 14-Jun-2023 13:57, QRS axis Shifted right QT has shortened Confirmed by Darra Chew [phone]) on 11/04/2023 3:43:24 PM        ____________________________________________  RADIOLOGY  CT ABDOMEN PELVIS W CONTRAST Result Date: 11/04/2023 CLINICAL DATA:  Left lower quadrant abdominal pain. EXAM: CT ABDOMEN AND PELVIS WITH CONTRAST TECHNIQUE: Multidetector CT imaging of the abdomen and pelvis was performed using the standard protocol following bolus administration of intravenous contrast. RADIATION DOSE REDUCTION: This exam was performed according to the departmental dose-optimization program which includes automated exposure control, adjustment of the mA and/or kV according to patient size and/or use of iterative reconstruction technique. CONTRAST:  80mL OMNIPAQUE  IOHEXOL  300 MG/ML  SOLN COMPARISON:  CT abdomen pelvis dated 06/28/2023. FINDINGS: Lower chest: Left lung base linear atelectasis/scarring. No intra-abdominal free air or free fluid. Hepatobiliary: The liver is unremarkable. No biliary duct eccentric 50 gallbladder is unremarkable. Pancreas: Unremarkable. No pancreatic ductal dilatation or surrounding inflammatory changes. Spleen: Normal in size without focal abnormality. Adrenals/Urinary Tract: The adrenal glands unremarkable. There is no hydronephrosis on either side. There is symmetric enhancement and excretion of contrast by both kidneys. The visualized ureters and urinary bladder appear unremarkable. Stomach/Bowel: There is sigmoid diverticulosis. Mild perisigmoid haziness may represent mild acute diverticulitis. Clinical correlation recommended. No abscess or perforation. There is no bowel obstruction. The appendix is normal. Vascular/Lymphatic: Mild aortoiliac atherosclerotic disease. The IVC is unremarkable. No portal venous gas. There is no adenopathy.  Reproductive: Hysterectomy.  No suspicious adnexal masses. Other: None Musculoskeletal: Degenerative changes.  No acute osseous pathology. IMPRESSION: 1. Sigmoid diverticulosis with possible mild acute diverticulitis. No abscess or perforation. 2. No bowel obstruction. Normal appendix. 3.  Aortic Atherosclerosis (ICD10-I70.0). Electronically Signed   By: Vanetta Chou M.D.   On: 11/04/2023 17:20    ____________________________________________   PROCEDURES  Procedure(s) performed:   Procedures  None  ____________________________________________   INITIAL IMPRESSION / ASSESSMENT AND PLAN / ED COURSE  Pertinent labs & imaging results that were available during my care of the patient were reviewed by me and considered in my medical decision making (see chart for details).   This patient is Presenting for Evaluation of weakness/GI bleeding, which does require a range of treatment options, and is a complaint that involves a high risk of morbidity and mortality.  The Differential Diagnoses includes diverticular bleed, diverticulitis, colitis, internal hemorrhoids, etc.  Critical Interventions-    Medications  amoxicillin-clavulanate (AUGMENTIN) 875-125 MG per tablet 1 tablet (has no administration in time range)  sodium chloride  0.9 % bolus 500 mL (0 mLs Intravenous Stopped 11/04/23 1721)  pantoprazole  (PROTONIX ) injection 40 mg (40 mg Intravenous Given 11/04/23 1554)  iohexol  (OMNIPAQUE ) 300 MG/ML solution 100 mL (80 mLs Intravenous Contrast Given 11/04/23 1701)    Reassessment after intervention: BP remains stable.    I did obtain Additional Historical Information from son at bedside.   Clinical Laboratory Tests Ordered, included CBC shows hemoglobin of 10.5 down  from 13.0 in March of this year.  Hemoccult positive.  UA without infection.  Normal LFTs and bilirubin.  Normal kidney function.  Radiologic Tests Ordered, included CT abdomen/pelvis. I independently interpreted the  images and agree with radiology interpretation.   Cardiac Monitor Tracing which shows NSR.    Social Determinants of Health Risk patient has a smoking history.   Medical Decision Making: Summary:  Patient presents the emergency department with passage of clot and blood.  On exam patient has dark maroon stool in the rectal vault.  No heavy bleeding but grossly positive.  Plan for CT abdomen pelvis along with screening blood work.  Reevaluation with update and discussion with patient and son at bedside.  Patient is feeling much better.  She is actually ordered takeout and is eating that here.  No additional bleeding.  She remains hemodynamically stable.  We discussed her diverticulitis finding on CT which I suspect is contributing to her bleeding.  I would like to put her on antibiotics.  She seems appropriate for outpatient follow-up and management.  I have placed a referral for GI.  Discussed strict ED return precautions for any increased blood.  Considered admission but seems appropriate for trial of outpatient management.  Patient's presentation is most consistent with acute presentation with potential threat to life or bodily function.   Disposition: discharge  ____________________________________________  FINAL CLINICAL IMPRESSION(S) / ED DIAGNOSES  Final diagnoses:  Diverticulitis  Rectal bleeding     NEW OUTPATIENT MEDICATIONS STARTED DURING THIS VISIT:  New Prescriptions   AMOXICILLIN-CLAVULANATE (AUGMENTIN) 875-125 MG TABLET    Take 1 tablet by mouth every 12 (twelve) hours for 10 days.    Note:  This document was prepared using Dragon voice recognition software and may include unintentional dictation errors.  Fonda Law, MD, [facility] Emergency Medicine    Breezie Micucci, Fonda MATSU, MD 11/04/23 [phone]

## 2023-11-04 NOTE — ED Notes (Signed)
Reviewed discharge instructions, medications, and home care with pt. Pt verbalized understanding and had no further questions. Pt exited ED without complications.

## 2023-11-07 ENCOUNTER — Encounter: Payer: Self-pay | Admitting: Gastroenterology

## 2023-11-09 ENCOUNTER — Encounter: Payer: Self-pay | Admitting: Podiatry

## 2023-11-09 ENCOUNTER — Ambulatory Visit: Admitting: Podiatry

## 2023-11-09 DIAGNOSIS — L600 Ingrowing nail: Secondary | ICD-10-CM | POA: Diagnosis not present

## 2023-11-09 NOTE — Progress Notes (Signed)
  Subjective:  Patient ID: Christina Sanchez, female    DOB: [birth year],   MRN: [id number]  Chief Complaint  Patient presents with   Nail Problem    I still have pain in it, mostly when I soak it and put the Betadine on it.  It seems like I was getting faster results when I was putting the Neosporin on it.  I was wondering if I should let it breathe more.    73 y.o. female presents for follow-up of avulsion of second digit nail. Relates doing better but still sore.   She does relate burning tingling in her legs and a feeling of walking on sand. Currently taking gabapentin . She is a smoker.  . Denies any other pedal complaints. Denies n/v/f/c.   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   Current every day smoker    Started smoking at age 35 (24.5 pack smoking history.  On going 1/2 PPD   Hyperlipidemia    Hypertension    MDD (major depressive disorder)    Without psychotic features.  Pending behavioral health evaluation.   Personal history of radiation therapy     Objective:  Physical Exam: Vascular: DP/PT pulses 2/4 bilateral. CFT <3 seconds. Normal hair growth on digits. No edema.  Skin. No lacerations or abrasions bilateral feet. Right second digit nail bed healed.  Musculoskeletal: MMT 5/5 bilateral lower extremities in DF, PF, Inversion and Eversion. Deceased ROM in DF of ankle joint.  Neurological: Sensation intact to light touch. Protective sensation diminished.   Assessment:   1. Ingrown nail of second toe of right foot        Plan:  Patient was evaluated and treated and all questions answered. Toe was evaluated and appears to be healing well.  May continue discontinue  soaks.  Advised on smoking cessation.  Patient to follow-up as needed   Asberry Failing, DPM

## 2024-01-02 ENCOUNTER — Ambulatory Visit: Admitting: Gastroenterology

## 2024-02-23 ENCOUNTER — Ambulatory Visit: Admitting: Gastroenterology
